# Patient Record
Sex: Female | Born: 2000 | Race: Black or African American | Hispanic: No | Marital: Single | Smoking: Never smoker
Health system: Southern US, Community
[De-identification: ages and names within clinical notes are randomized; demographics above are authoritative.]

## PROBLEM LIST (undated history)

## (undated) DIAGNOSIS — D573 Sickle-cell trait: Secondary | ICD-10-CM

## (undated) DIAGNOSIS — G43909 Migraine, unspecified, not intractable, without status migrainosus: Secondary | ICD-10-CM

## (undated) DIAGNOSIS — D649 Anemia, unspecified: Secondary | ICD-10-CM

## (undated) HISTORY — PX: ADENOIDECTOMY: SUR15

## (undated) HISTORY — DX: Sickle-cell trait: D57.3

## (undated) HISTORY — DX: Migraine, unspecified, not intractable, without status migrainosus: G43.909

## (undated) HISTORY — PX: STENT PLACEMENT VASCULAR (ARMC HX): HXRAD1737

---
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
## 2021-01-06 ENCOUNTER — Other Ambulatory Visit: Payer: Medicaid Other

## 2021-01-15 ENCOUNTER — Encounter (HOSPITAL_COMMUNITY): Payer: Self-pay

## 2021-01-15 ENCOUNTER — Ambulatory Visit (HOSPITAL_COMMUNITY)
Admission: EM | Admit: 2021-01-15 | Discharge: 2021-01-15 | Disposition: A | Discharge status: Home / Self Care | Payer: Medicaid Other | Attending: Student | Admitting: Student

## 2021-01-15 ENCOUNTER — Other Ambulatory Visit: Payer: Self-pay

## 2021-01-15 DIAGNOSIS — E119 Type 2 diabetes mellitus without complications: Secondary | ICD-10-CM

## 2021-01-15 DIAGNOSIS — Z7984 Long term (current) use of oral hypoglycemic drugs: Secondary | ICD-10-CM | POA: Diagnosis not present

## 2021-01-15 DIAGNOSIS — E118 Type 2 diabetes mellitus with unspecified complications: Secondary | ICD-10-CM | POA: Diagnosis not present

## 2021-01-15 DIAGNOSIS — S46911A Strain of unspecified muscle, fascia and tendon at shoulder and upper arm level, right arm, initial encounter: Secondary | ICD-10-CM

## 2021-01-15 MED ORDER — PREDNISONE 20 MG PO TABS: 20.0000 mg | ORAL_TABLET | Freq: Every day | ORAL | 0 refills | Status: AC

## 2021-01-15 MED ORDER — TIZANIDINE HCL 2 MG PO CAPS: 2.0000 mg | ORAL_CAPSULE | Freq: Three times a day (TID) | ORAL | 0 refills | Status: DC

## 2021-01-15 MED ORDER — NAPROXEN 500 MG PO TABS: 500.0000 mg | ORAL_TABLET | Freq: Two times a day (BID) | ORAL | 0 refills | Status: DC

## 2021-01-15 NOTE — ED Provider Notes (Signed)
[facility]    CSN: [id number] Arrival date & time: 01/15/21  1003      History   Chief Complaint Chief Complaint  Patient presents with  . Shoulder Pain    HPI [name] is a 20 y.o. female presenting with R shoulder pain. History migraines, goiter, constipation, iron deficiency, morbid obesity, menorrhagia.  Endorses 1 week of right shoulder pain.  States this started when she woke up from sleep 1 week ago.  States she does not sleep on her right side, sleeps in multiple positions throughout the night.  Right-sided neck pain as well.  Describes his pain as mild at rest, worse with abduction of her right arm.  She is right-handed.  Denies numbness/tingling, radiation of pain down arm, back pain.  Denies trauma.  Denies GI symptoms.  States she could not be pregnant.  HPI  Past Medical History:  Diagnosis Date  . Migraine   . Sickle cell trait [facility](HCC)     Patient Active Problem List   Diagnosis Date Noted  . Goiter 12/21/2020  . Slow transit constipation 12/21/2020  . Iron deficiency 12/21/2020  . Absence of bladder continence 12/21/2020  . Morbid childhood obesity with BMI greater than 99th percentile for age [facility](HCC) 08/07/2017  . Pre-diabetes 08/07/2017  . Anovulatory cycle 08/07/2017  . Menorrhagia with irregular cycle 08/07/2017    Past Surgical History:  Procedure Laterality Date  . ADENOIDECTOMY      OB History    Gravida  0   Para  0   Term  0   Preterm  0   AB  0   Living  0     SAB  0   IAB  0   Ectopic  0   Multiple  0   Live Births  0            Home Medications    Prior to Admission medications   Medication Sig Start Date End Date Taking? Authorizing Provider  naproxen (NAPROSYN) 500 MG tablet Take 1 tablet (500 mg total) by mouth 2 (two) times daily. 01/15/21  Yes Rhys MartiniGraham, Chaye Misch E, PA-C  predniSONE (DELTASONE) 20 MG tablet Take 1 tablet (20 mg total) by mouth daily for 5 days. 01/15/21 01/20/21 Yes Rhys MartiniGraham, Gillie Fleites E, PA-C   tizanidine (ZANAFLEX) 2 MG capsule Take 1 capsule (2 mg total) by mouth 3 (three) times daily. 01/15/21  Yes Rhys MartiniGraham, Aakash Hollomon E, PA-C  Ferrous Fumarate (HEMOCYTE - 106 MG FE) 324 (106 Fe) MG TABS tablet Take 1 tablet (106 mg of iron total) by mouth daily. 12/21/20   Verneda SkillHacker, Caroline T, FNP  metFORMIN (GLUCOPHAGE XR) 500 MG 24 hr tablet Take 1 tablet (500 mg total) by mouth daily with breakfast for 14 days, THEN 2 tablets (1,000 mg total) daily with breakfast for 14 days. 12/21/20 01/18/21  Verneda SkillHacker, Caroline T, FNP  norethindrone-ethinyl estradiol (JUNEL FE 1/20) 1-20 MG-MCG tablet Take 1 tablet by mouth daily. Patient not taking: Reported on 12/29/2019 12/09/19 01/09/20  Dahlia ByesBast, Traci A, NP  norgestrel-ethinyl estradiol (LO/OVRAL) 0.3-30 MG-MCG tablet Take 2 tablets by mouth daily. Until bleeding stops. Then take 1 tablet daily to finish pack. 12/29/19 01/09/20  Dessa PhiBadik, Jennifer, MD    Family History Family History  Problem Relation Age of Onset  . Sickle cell trait Mother   . Anxiety disorder Mother   . Migraines Brother   . Allergic rhinitis Brother   . Migraines Maternal Grandmother   . Hypertension Maternal Grandmother   .  Heart disease Maternal Grandmother   . Asthma Paternal Grandmother   . Menstrual problems Sister   . Allergic rhinitis Sister   . Hypertension Maternal Grandfather   . Hyperlipidemia Maternal Grandfather   . Sickle cell trait Maternal Grandfather   . Menstrual problems Paternal Aunt   . Fibroids Paternal Aunt     Social History Social History   Tobacco Use  . Smoking status: Passive Smoke Exposure - Never Smoker  . Smokeless tobacco: Never Used  . Tobacco comment: smoking outside  Vaping Use  . Vaping Use: Never used  Substance Use Topics  . Alcohol use: No  . Drug use: No     Allergies   Patient has no known allergies.   Review of Systems Review of Systems  Musculoskeletal:       R shoulder pain  All other systems reviewed and are negative.    Physical  Exam Triage Vital Signs ED Triage Vitals  Enc Vitals Group     BP      Pulse      Resp      Temp      Temp src      SpO2      Weight      Height      Head Circumference      Peak Flow      Pain Score      Pain Loc      Pain Edu?      Excl. in GC?    No data found.  Updated Vital Signs BP (!) 147/82 (BP Location: Right Arm)   Pulse 88   Temp 98.9 F (37.2 C)   Resp 18   SpO2 100%   Visual Acuity Right Eye Distance:   Left Eye Distance:   Bilateral Distance:    Right Eye Near:   Left Eye Near:    Bilateral Near:     Physical Exam Vitals reviewed.  Constitutional:      General: She is not in acute distress.    Appearance: Normal appearance. She is not ill-appearing.  HENT:     Head: Normocephalic and atraumatic.  Eyes:     Extraocular Movements: Extraocular movements intact.     Pupils: Pupils are equal, round, and reactive to light.  Cardiovascular:     Rate and Rhythm: Normal rate and regular rhythm.     Heart sounds: Normal heart sounds.  Pulmonary:     Effort: Pulmonary effort is normal.     Breath sounds: Normal breath sounds and air entry.  Abdominal:     Palpations: Abdomen is soft.     Tenderness: There is no abdominal tenderness. There is no right CVA tenderness, left CVA tenderness, guarding or rebound.     Comments: No bowel or bladder incontinence.  Musculoskeletal:     Cervical back: Normal range of motion. No swelling, deformity, signs of trauma, rigidity, spasms, tenderness, bony tenderness or crepitus. No pain with movement.     Thoracic back: No swelling, deformity, signs of trauma, spasms, tenderness or bony tenderness. Normal range of motion. No scoliosis.     Lumbar back: No swelling, deformity, signs of trauma, spasms, tenderness or bony tenderness. Normal range of motion. Negative right straight leg raise test and negative left straight leg raise test. No scoliosis.     Comments: Strength 5/5 in UEs and LEs, sensation intact. R  shoulder pain elicited with abduction R arm. Proximal R trapexius pain to deep palpation. R-sided cervical paraspinous  tenderness to dep palpation; pain elicited with flexion cervical spine. No cervical spinous tenderness, deformity, stepoff. Negative: crossbody abduction, Apley scratch test, empty beer can, Neer test. Grip strength 5/5.   absolutely no other pain, tenderness, deformity, ecchymosis.  Skin:    Capillary Refill: Capillary refill takes less than 2 seconds.  Neurological:     General: No focal deficit present.     Mental Status: She is alert.     Cranial Nerves: No cranial nerve deficit.     Comments: Strength 5/5 in UEs and LEs. Gait normal. Sensation intact in UEs and LEs.   Psychiatric:        Mood and Affect: Mood normal.        Behavior: Behavior normal.        Thought Content: Thought content normal.        Judgment: Judgment normal.      UC Treatments / Results  Labs (all labs ordered are listed, but only abnormal results are displayed) Labs Reviewed - No data to display  EKG   Radiology No results found.  Procedures Procedures (including critical care time)  Medications Ordered in UC Medications - No data to display  Initial Impression / Assessment and Plan / UC Course  I have reviewed the triage vital signs and the nursing notes.  Pertinent labs & imaging results that were available during my care of the patient were reviewed by me and considered in my medical decision making (see chart for details).     This patient is a 20 year old female presenting with right shoulder impingement. Today this pt is afebrile nontachycardic nontachypneic, oxygenating well on room air, no wheezes rhonchi or rales.   Zanaflex and naproxen as below.  Last A1c was 6.3 1 month ago. Plan to treat with low-dose of prednisone- 20mg  x5 days. Continue metformin.  States she could not be pregnant.  Return precautions discussed.  This chart was dictated using voice  recognition software, Dragon. Despite the best efforts of this provider to proofread and correct errors, errors may still occur which can change documentation meaning.   Final Clinical Impressions(s) / UC Diagnoses   Final diagnoses:  Strain of right shoulder, initial encounter  Type 2 diabetes mellitus without complication, without long-term current use of insulin (HCC)  Diabetes mellitus treated with oral medication (HCC)     Discharge Instructions     -Use the muscle relaxer-Zanaflex (tizanidine)-up to 3 times daily for muscular spasms and pain.  This can make you drowsy, so take before bed or when you do not have to drive or operate machinery. -Also start the prednisone, 1 pill daily for 5 days.  Try to take this earlier in the day as it can give you energy. -Use the pain medication as needed-naproxen, 1 pill up to twice daily with food.  Avoid other NSAIDs while on this medication, like ibuprofen or Aleve.  You can still take Tylenol 1000 mg 3 times daily. -Seek additional medical attention if you experience worsening of symptoms despite treatment.    ED Prescriptions    Medication Sig Dispense Auth. Provider   tizanidine (ZANAFLEX) 2 MG capsule Take 1 capsule (2 mg total) by mouth 3 (three) times daily. 21 capsule , PA-C   predniSONE (DELTASONE) 20 MG tablet Take 1 tablet (20 mg total) by mouth daily for 5 days. 5 tablet Rhys Martini, PA-C   naproxen (NAPROSYN) 500 MG tablet Take 1 tablet (500 mg total) by mouth 2 (two) times daily.  30 tablet Rhys Martini, PA-C     PDMP not reviewed this encounter.   Rhys Martini, PA-C 01/15/21 1113

## 2021-01-15 NOTE — Discharge Instructions (Addendum)
-  Use the muscle relaxer-Zanaflex (tizanidine)-up to 3 times daily for muscular spasms and pain.  This can make you drowsy, so take before bed or when you do not have to drive or operate machinery. -Also start the prednisone, 1 pill daily for 5 days.  Try to take this earlier in the day as it can give you energy. -Use the pain medication as needed-naproxen, 1 pill up to twice daily with food.  Avoid other NSAIDs while on this medication, like ibuprofen or Aleve.  You can still take Tylenol 1000 mg 3 times daily. -Seek additional medical attention if you experience worsening of symptoms despite treatment.

## 2021-01-15 NOTE — ED Triage Notes (Signed)
Pt in with c/o right shoulder pain that has been going on for 1 week. States she was just sleeping and woke up with pain  Pt has taken tylenol, ibuprofen and warm compress with no relief

## 2021-01-18 ENCOUNTER — Ambulatory Visit: Payer: Medicaid Other | Admitting: Pediatrics

## 2021-02-23 ENCOUNTER — Other Ambulatory Visit (INDEPENDENT_AMBULATORY_CARE_PROVIDER_SITE_OTHER): Payer: Self-pay | Admitting: Pediatric Endocrinology

## 2021-03-26 ENCOUNTER — Encounter (HOSPITAL_COMMUNITY): Payer: Self-pay | Admitting: Emergency Medicine

## 2021-03-26 ENCOUNTER — Ambulatory Visit (HOSPITAL_COMMUNITY)
Admission: EM | Admit: 2021-03-26 | Discharge: 2021-03-26 | Disposition: A | Discharge status: Home / Self Care | Payer: Medicaid Other | Attending: Internal Medicine | Admitting: Internal Medicine

## 2021-03-26 DIAGNOSIS — Z3202 Encounter for pregnancy test, result negative: Secondary | ICD-10-CM | POA: Diagnosis not present

## 2021-03-26 DIAGNOSIS — F410 Panic disorder [episodic paroxysmal anxiety] without agoraphobia: Secondary | ICD-10-CM | POA: Diagnosis not present

## 2021-03-26 LAB — POCT URINALYSIS DIPSTICK, ED / UC
Glucose, UA: NEGATIVE mg/dL
Hgb urine dipstick: NEGATIVE
Leukocytes,Ua: NEGATIVE
Nitrite: NEGATIVE
Protein, ur: NEGATIVE mg/dL
Specific Gravity, Urine: 1.025 (ref 1.005–1.030)
Urobilinogen, UA: 0.2 mg/dL (ref 0.0–1.0)
pH: 6 (ref 5.0–8.0)

## 2021-03-26 LAB — POC URINE PREG, ED: Preg Test, Ur: NEGATIVE

## 2021-03-26 MED ORDER — HYDROXYZINE HCL 25 MG PO TABS: 25.0000 mg | ORAL_TABLET | Freq: Three times a day (TID) | ORAL | 0 refills | Status: DC | PRN

## 2021-03-26 NOTE — Discharge Instructions (Addendum)
-  I sent a medication that you can take as needed for anxiety and panic attacks.  This is called hydroxyzine (Atarax), 1 pill up to every 8 hours as needed.  This medication can make you drowsy, so be careful to avoid driving and operating machinery. -Follow-up with your primary care provider to discuss other medication options for anxiety like an SSRI. -If you ever experience suicidal ideation, thoughts of hurting yourself, or homicidal ideation-head straight to the emergency department or call 911.

## 2021-03-26 NOTE — ED Provider Notes (Signed)
[facility]    CSN: [id number] Arrival date & time: 03/26/21  1233      History   Chief Complaint Chief Complaint  Patient presents with  . Headache  . Nausea    HPI Heather Marsh is a 20 y.o. female presenting with residual headaches and nausea following panic attack that occurred 1 day ago.  Medical history anxiety and panic attacks, though she does not take any medications for this.  States she has a history of panic attacks related to social anxiety, but has not had one in months.  States she did have 1 yesterday that lasted about 30 minutes.  Endorses sensation of dizziness and shortness of breath during the episode.  She was at home on the couch watching TV when it happened.  Denied chest pain during or after the event.  Currently with some nausea and throbbing headache behind forehead, but no vomiting, diarrhea, back pain.  Denies family history of early cardiac disease or death.  HPI  Past Medical History:  Diagnosis Date  . Migraine   . Sickle cell trait [facility])     Patient Active Problem List   Diagnosis Date Noted  . Goiter 12/21/2020  . Slow transit constipation 12/21/2020  . Iron deficiency 12/21/2020  . Absence of bladder continence 12/21/2020  . Morbid childhood obesity with BMI greater than 99th percentile for age [facility]) 08/07/2017  . Pre-diabetes 08/07/2017  . Anovulatory cycle 08/07/2017  . Menorrhagia with irregular cycle 08/07/2017    Past Surgical History:  Procedure Laterality Date  . ADENOIDECTOMY      OB History    Gravida  0   Para  0   Term  0   Preterm  0   AB  0   Living  0     SAB  0   IAB  0   Ectopic  0   Multiple  0   Live Births  0            Home Medications    Prior to Admission medications   Medication Sig Start Date End Date Taking? Authorizing Provider  hydrOXYzine (ATARAX/VISTARIL) 25 MG tablet Take 1 tablet (25 mg total) by mouth every 8 (eight) hours as needed. 03/26/21  Yes Rhys Martini,  PA-C  metFORMIN (GLUCOPHAGE XR) 500 MG 24 hr tablet Take 1 tablet (500 mg total) by mouth daily with breakfast for 14 days, THEN 2 tablets (1,000 mg total) daily with breakfast for 14 days. 12/21/20 01/18/21 Yes Verneda Skill, FNP  Ferrous Fumarate (HEMOCYTE - 106 MG FE) 324 (106 Fe) MG TABS tablet Take 1 tablet (106 mg of iron total) by mouth daily. 12/21/20 03/26/21  Verneda Skill, FNP  norethindrone-ethinyl estradiol (JUNEL FE 1/20) 1-20 MG-MCG tablet Take 1 tablet by mouth daily. Patient not taking: Reported on 12/29/2019 12/09/19 01/09/20  Dahlia Byes A, NP  norgestrel-ethinyl estradiol (LO/OVRAL) 0.3-30 MG-MCG tablet Take 2 tablets by mouth daily. Until bleeding stops. Then take 1 tablet daily to finish pack. 12/29/19 01/09/20  Dessa Phi, MD    Family History Family History  Problem Relation Age of Onset  . Sickle cell trait Mother   . Anxiety disorder Mother   . Migraines Brother   . Allergic rhinitis Brother   . Migraines Maternal Grandmother   . Hypertension Maternal Grandmother   . Heart disease Maternal Grandmother   . Asthma Paternal Grandmother   . Menstrual problems Sister   . Allergic rhinitis Sister   .  Hypertension Maternal Grandfather   . Hyperlipidemia Maternal Grandfather   . Sickle cell trait Maternal Grandfather   . Menstrual problems Paternal Aunt   . Fibroids Paternal Aunt     Social History Social History   Tobacco Use  . Smoking status: Passive Smoke Exposure - Never Smoker  . Smokeless tobacco: Never Used  . Tobacco comment: smoking outside  Vaping Use  . Vaping Use: Never used  Substance Use Topics  . Alcohol use: No  . Drug use: No     Allergies   Patient has no known allergies.   Review of Systems Review of Systems  Gastrointestinal: Positive for nausea.  Neurological: Positive for headaches.  Psychiatric/Behavioral: The patient is nervous/anxious.   All other systems reviewed and are negative.    Physical Exam Triage Vital  Signs ED Triage Vitals  Enc Vitals Group     BP 03/26/21 1419 129/88     Pulse Rate 03/26/21 1419 100     Resp 03/26/21 1419 18     Temp 03/26/21 1419 97.9 F (36.6 C)     Temp Source 03/26/21 1419 Oral     SpO2 03/26/21 1419 99 %     Weight --      Height --      Head Circumference --      Peak Flow --      Pain Score 03/26/21 1414 4     Pain Loc --      Pain Edu? --      Excl. in GC? --    No data found.  Updated Vital Signs BP 129/88 (BP Location: Left Arm)   Pulse 100   Temp 97.9 F (36.6 C) (Oral)   Resp 18   LMP 01/21/2021 (Approximate)   SpO2 99%   Visual Acuity Right Eye Distance:   Left Eye Distance:   Bilateral Distance:    Right Eye Near:   Left Eye Near:    Bilateral Near:     Physical Exam Vitals reviewed.  Constitutional:      General: She is not in acute distress.    Appearance: Normal appearance. She is not ill-appearing.  HENT:     Head: Normocephalic and atraumatic.     Mouth/Throat:     Mouth: Mucous membranes are moist.     Comments: Moist mucous membranes Eyes:     Extraocular Movements: Extraocular movements intact.     Pupils: Pupils are equal, round, and reactive to light.  Cardiovascular:     Rate and Rhythm: Normal rate and regular rhythm.     Heart sounds: Normal heart sounds.  Pulmonary:     Effort: Pulmonary effort is normal.     Breath sounds: Normal breath sounds. No wheezing, rhonchi or rales.  Abdominal:     General: Bowel sounds are normal. There is no distension.     Palpations: Abdomen is soft. There is no mass.     Tenderness: There is no abdominal tenderness. There is no right CVA tenderness, left CVA tenderness, guarding or rebound.  Skin:    General: Skin is warm.     Capillary Refill: Capillary refill takes less than 2 seconds.     Comments: Good skin turgor  Neurological:     General: No focal deficit present.     Mental Status: She is alert and oriented to person, place, and time.     Comments: PERRLA,  EOMI CN 2-12 grossly intact  Psychiatric:  Mood and Affect: Mood normal.        Behavior: Behavior normal.      UC Treatments / Results  Labs (all labs ordered are listed, but only abnormal results are displayed) Labs Reviewed  POCT URINALYSIS DIPSTICK, ED / UC - Abnormal; Notable for the following components:      Result Value   Bilirubin Urine SMALL (*)    Ketones, ur TRACE (*)    All other components within normal limits  POC URINE PREG, ED    EKG   Radiology No results found.  Procedures Procedures (including critical care time)  Medications Ordered in UC Medications - No data to display  Initial Impression / Assessment and Plan / UC Course  I have reviewed the triage vital signs and the nursing notes.  Pertinent labs & imaging results that were available during my care of the patient were reviewed by me and considered in my medical decision making (see chart for details).     This patient is an 20 year old female presenting following panic attack that occurred 1 day ago.  Benign exam today.  Irregular periods. Negative pregnancy test today.  Hydroxyzine sent as below.  Follow-up with PCP. Denies SI, HI.  ED return precautions discussed.  Final Clinical Impressions(s) / UC Diagnoses   Final diagnoses:  Negative pregnancy test  Panic attack     Discharge Instructions     -I sent a medication that you can take as needed for anxiety and panic attacks.  This is called hydroxyzine (Atarax), 1 pill up to every 8 hours as needed.  This medication can make you drowsy, so be careful to avoid driving and operating machinery. -Follow-up with your primary care provider to discuss other medication options for anxiety like an SSRI. -If you ever experience suicidal ideation, thoughts of hurting yourself, or homicidal ideation-head straight to the emergency department or call 911.    ED Prescriptions    Medication Sig Dispense Auth. Provider   hydrOXYzine  (ATARAX/VISTARIL) 25 MG tablet Take 1 tablet (25 mg total) by mouth every 8 (eight) hours as needed. 21 tablet Rhys Martini, PA-C     PDMP not reviewed this encounter.   Rhys Martini, PA-C 03/26/21 1529

## 2021-03-26 NOTE — ED Triage Notes (Signed)
Pt reports having a panic attack yesterday. Today she has a headache and stomach ache. LMP 01/21/2021 (irregular menses) Denies being sexually active.

## 2021-09-20 ENCOUNTER — Other Ambulatory Visit: Payer: Self-pay | Admitting: Pediatrics

## 2021-09-20 DIAGNOSIS — R7303 Prediabetes: Secondary | ICD-10-CM

## 2021-10-16 IMAGING — US US PELVIS COMPLETE WITH TRANSVAGINAL
1 series · 15 of 25 positions shown · non-contrast
Comparison: None

CLINICAL DATA: Abnormal uterine bleeding

EXAM:
TRANSABDOMINAL AND TRANSVAGINAL ULTRASOUND OF PELVIS
TECHNIQUE: Both transabdominal and transvaginal ultrasound examinations of the
pelvis were performed. Transabdominal technique was performed for
global imaging of the pelvis including uterus, ovaries, adnexal
regions, and pelvic cul-de-sac. It was necessary to proceed with
endovaginal exam following the transabdominal exam to visualize the
endometrium and ovaries.

[Series 1: us pelvis complete with transvaginal · 15 of 61 slices shown]
[im 1/61]
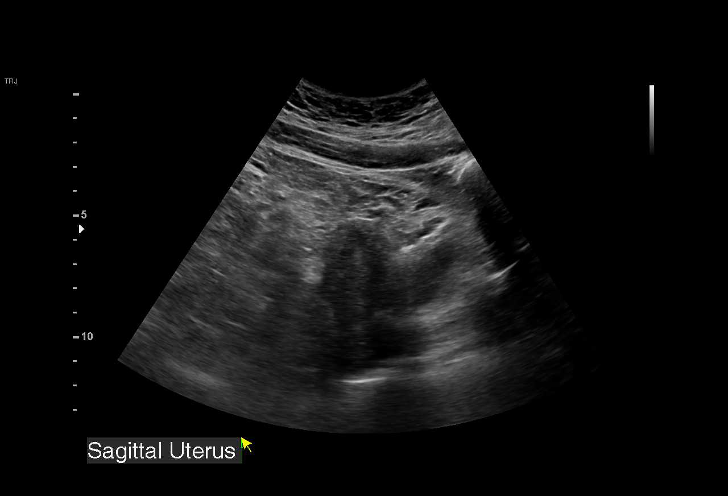
[im 6/61]
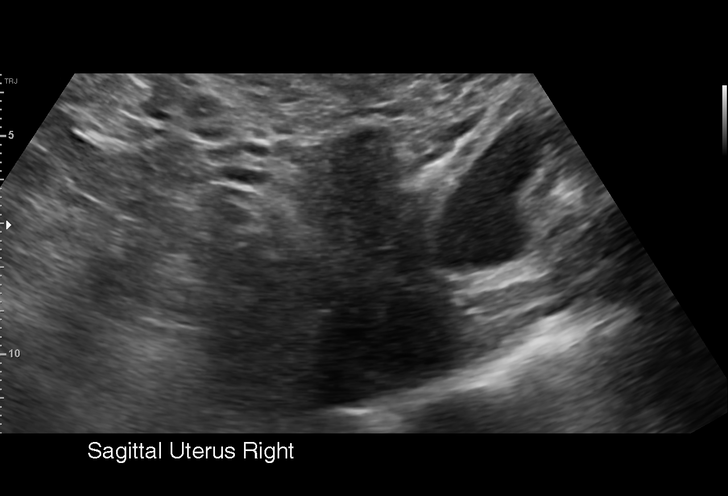
[im 11/61]
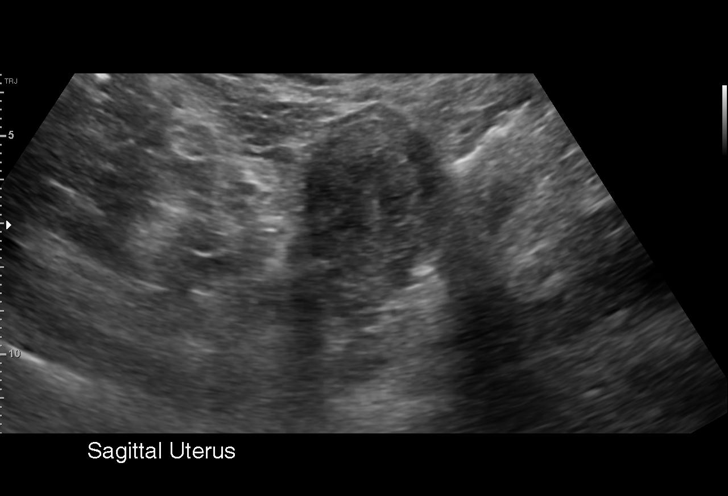
[im 13/61]
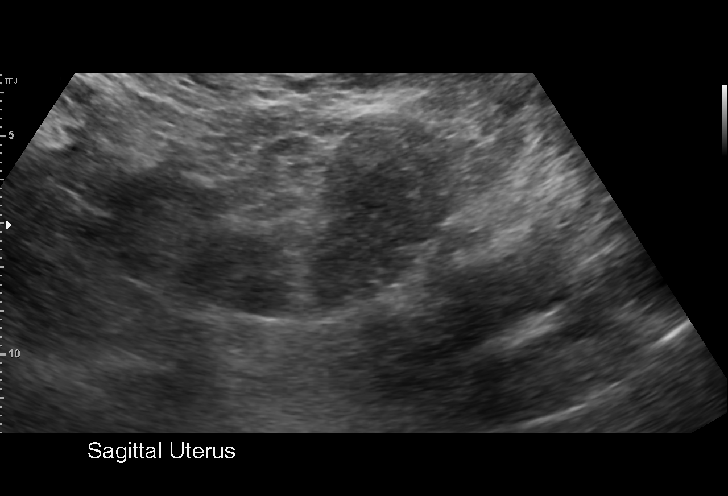
[im 18/61]
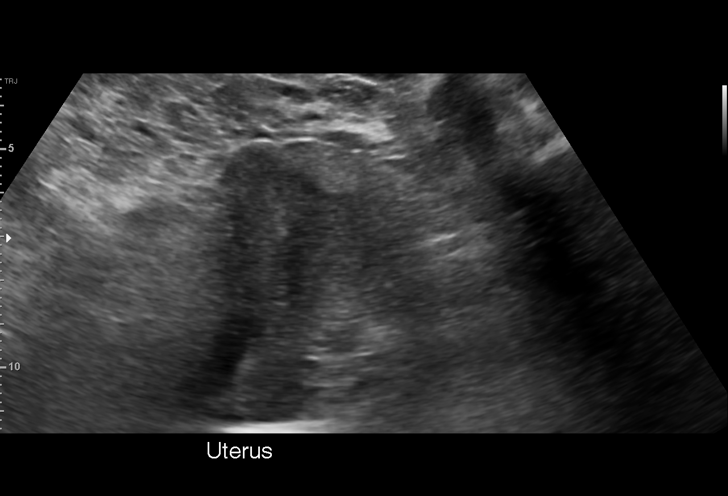
[im 23/61]
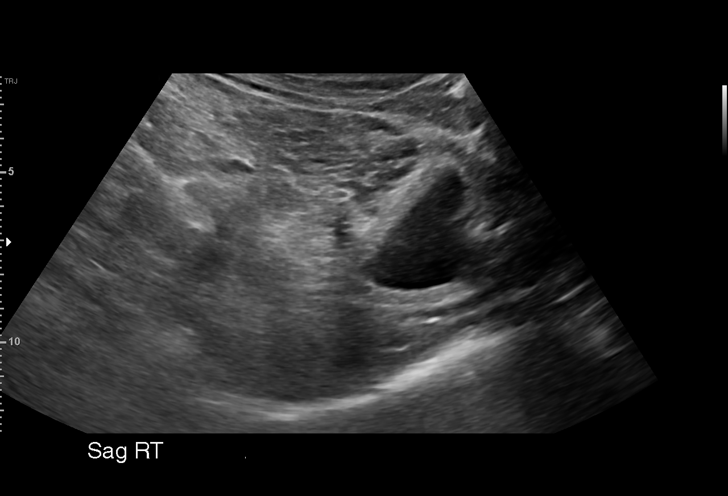
[im 26/61]
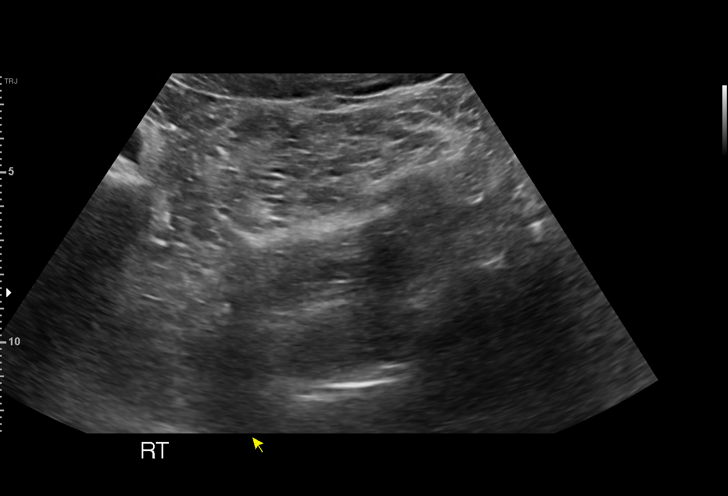
[im 31/61]
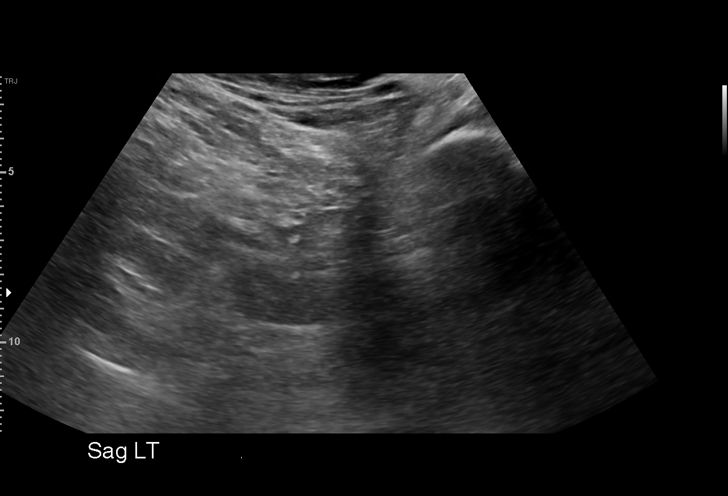
[im 36/61]
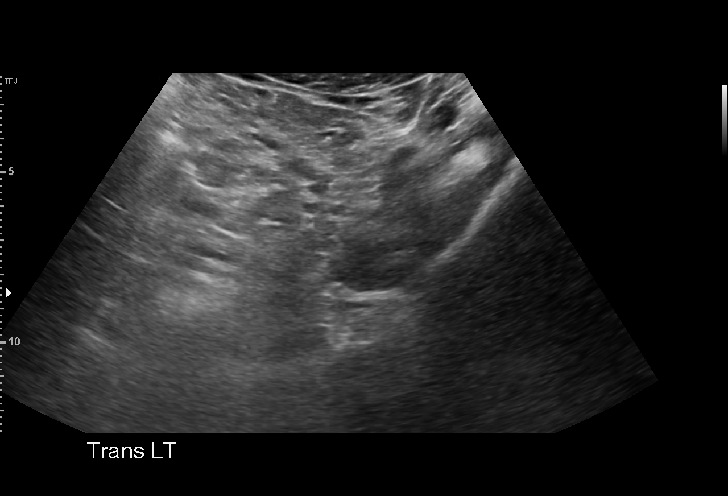
[im 38/61]
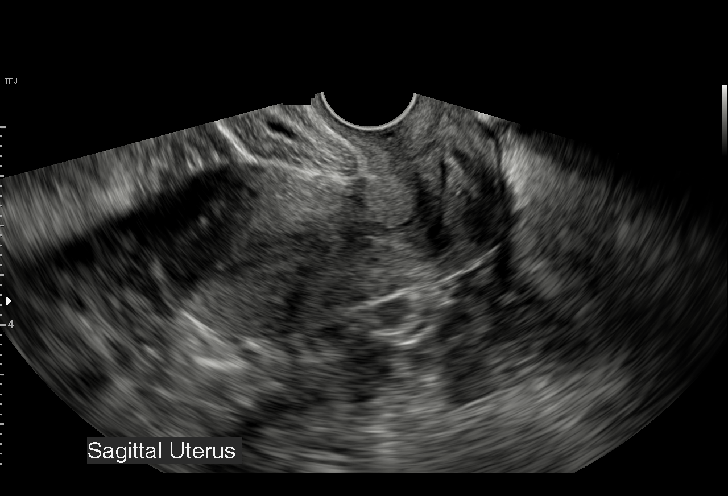
[im 43/61]
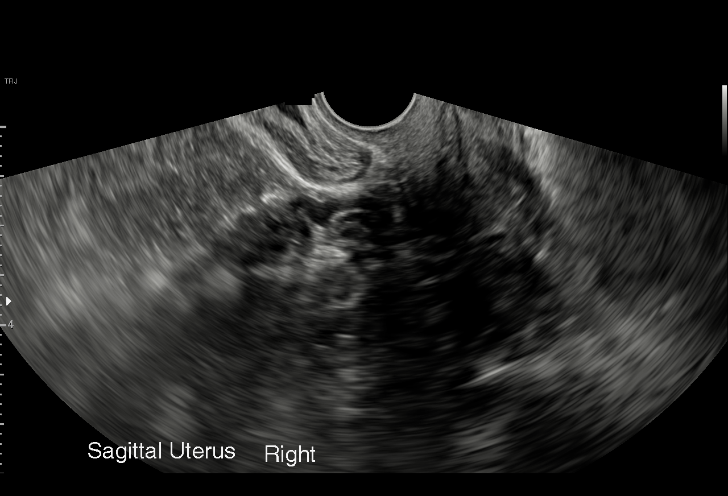
[im 48/61]
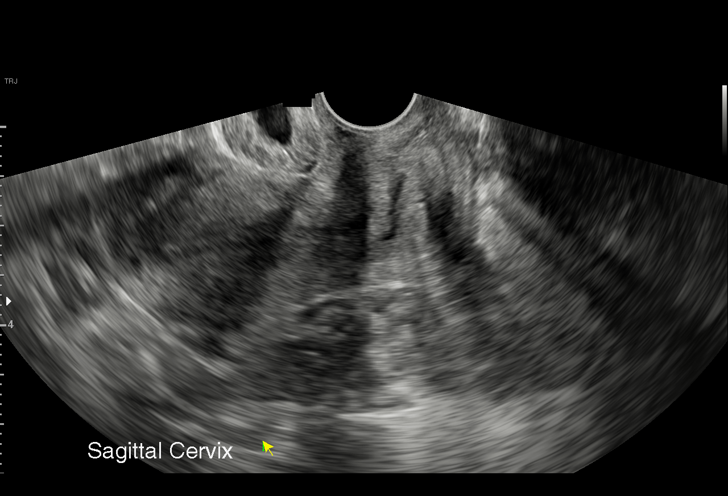
[im 51/61]
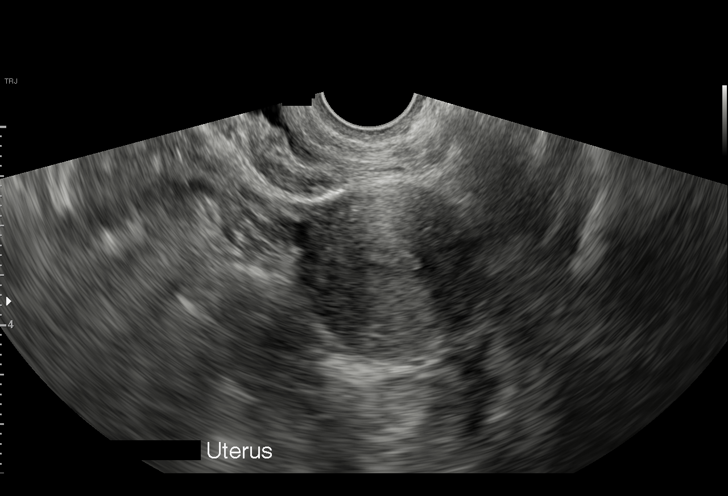
[im 56/61]
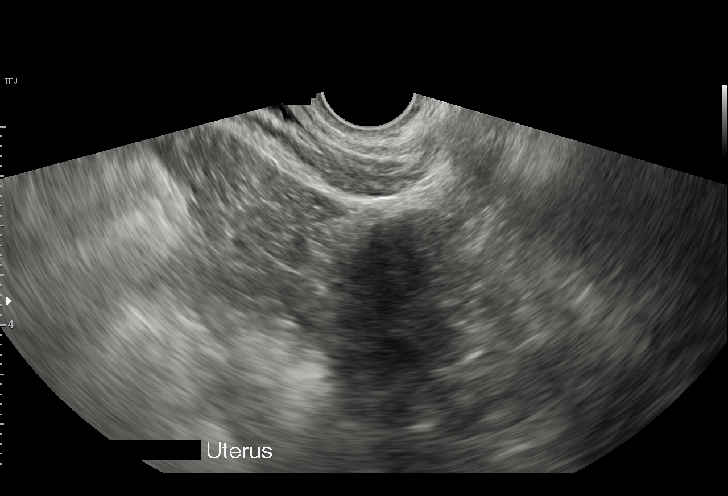
[im 61/61]
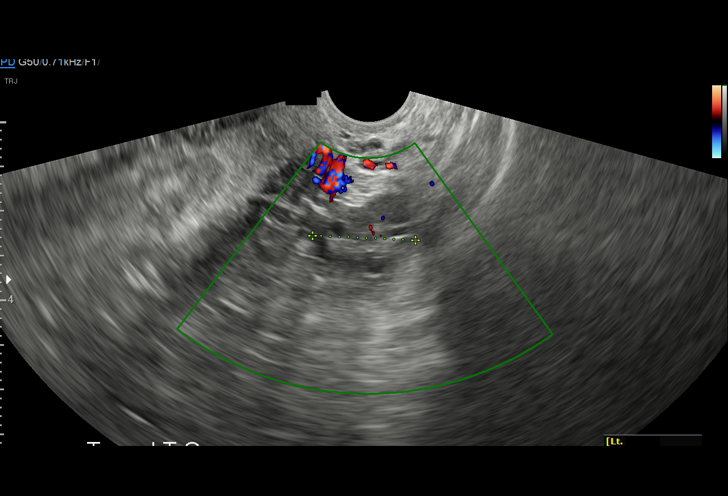

[15 of 25 positions shown; findings below may reference images not displayed]

FINDINGS: Uterus

Measurements: 6.8 x 3.6 x 3.6 cm = volume: 46 mL. Anteverted.
Heterogeneous myometrium. No focal mass.

Endometrium

Thickness: 3 mm.  No endometrial fluid or focal abnormality

Right ovary

Measurements: 3.4 x 2.0 x 2.3 cm = volume: 7.9 mL. Partially
obscured by bowel. No gross mass.

Left ovary

Measurements: 2.9 x 2.1 x 2.3 cm = volume: 7.3 mL. Normal morphology
without mass

Other findings

No free pelvic fluid or adnexal masses
IMPRESSION: Normal exam.

## 2022-06-02 DIAGNOSIS — K219 Gastro-esophageal reflux disease without esophagitis: Secondary | ICD-10-CM | POA: Insufficient documentation

## 2022-09-16 ENCOUNTER — Ambulatory Visit (HOSPITAL_COMMUNITY)
Admission: EM | Admit: 2022-09-16 | Discharge: 2022-09-16 | Disposition: A | Discharge status: Home / Self Care | Payer: Medicaid Other

## 2022-09-16 ENCOUNTER — Ambulatory Visit: Admit: 2022-09-16 | Discharge status: Against Medical Advice | Payer: Medicaid Other

## 2022-09-16 ENCOUNTER — Encounter (HOSPITAL_COMMUNITY): Payer: Self-pay

## 2022-09-16 DIAGNOSIS — B309 Viral conjunctivitis, unspecified: Secondary | ICD-10-CM

## 2022-09-16 NOTE — Discharge Instructions (Signed)
Can continue with allergy eye drops Could begin an allergy medication like Claritin or Zyrtec Avoid eye makeup until symptoms resolve Return if you develop new or worsening symptoms

## 2022-09-16 NOTE — ED Provider Notes (Signed)
[facility]    CSN: [id number] Arrival date & time: 09/16/22  1328      History   Chief Complaint Chief Complaint  Patient presents with   Conjunctivitis    HPI Heather Marsh is a 21 y.o. female.   Pt reports Thursday morning she woke up with eyes crusted shut due to green discharge.  She reports since then sx have improved.  She reports mild crusting upon waking today.  She denies congestion, rhinorrhea, sore throat.  She denies wearing glasses or contacts.  She has been using allergy eye drops with improvement.     Past Medical History:  Diagnosis Date   Migraine    Sickle cell trait [facility])     Patient Active Problem List   Diagnosis Date Noted   Goiter 12/21/2020   Slow transit constipation 12/21/2020   Iron deficiency 12/21/2020   Absence of bladder continence 12/21/2020   Morbid childhood obesity with BMI greater than 99th percentile for age [facility]) 08/07/2017   Pre-diabetes 08/07/2017   Anovulatory cycle 08/07/2017   Menorrhagia with irregular cycle 08/07/2017    Past Surgical History:  Procedure Laterality Date   ADENOIDECTOMY      OB History     Gravida  0   Para  0   Term  0   Preterm  0   AB  0   Living  0      SAB  0   IAB  0   Ectopic  0   Multiple  0   Live Births  0            Home Medications    Prior to Admission medications   Medication Sig Start Date End Date Taking? Authorizing Provider  hydrOXYzine (ATARAX/VISTARIL) 25 MG tablet Take 1 tablet (25 mg total) by mouth every 8 (eight) hours as needed. 03/26/21   Rhys Martini, PA-C  metFORMIN (GLUCOPHAGE XR) 500 MG 24 hr tablet Take 1 tablet (500 mg total) by mouth daily with breakfast for 14 days, THEN 2 tablets (1,000 mg total) daily with breakfast for 14 days. 12/21/20 01/18/21  Verneda Skill, FNP  Ferrous Fumarate (HEMOCYTE - 106 MG FE) 324 (106 Fe) MG TABS tablet Take 1 tablet (106 mg of iron total) by mouth daily. 12/21/20 03/26/21  Verneda Skill, FNP   norethindrone-ethinyl estradiol (JUNEL FE 1/20) 1-20 MG-MCG tablet Take 1 tablet by mouth daily. Patient not taking: Reported on 12/29/2019 12/09/19 01/09/20  Dahlia Byes A, NP  norgestrel-ethinyl estradiol (LO/OVRAL) 0.3-30 MG-MCG tablet Take 2 tablets by mouth daily. Until bleeding stops. Then take 1 tablet daily to finish pack. 12/29/19 01/09/20  Dessa Phi, MD    Family History Family History  Problem Relation Age of Onset   Sickle cell trait Mother    Anxiety disorder Mother    Migraines Brother    Allergic rhinitis Brother    Migraines Maternal Grandmother    Hypertension Maternal Grandmother    Heart disease Maternal Grandmother    Asthma Paternal Grandmother    Menstrual problems Sister    Allergic rhinitis Sister    Hypertension Maternal Grandfather    Hyperlipidemia Maternal Grandfather    Sickle cell trait Maternal Grandfather    Menstrual problems Paternal Aunt    Fibroids Paternal Aunt     Social History Social History   Tobacco Use   Smoking status: Passive Smoke Exposure - Never Smoker   Smokeless tobacco: Never   Tobacco comments:  smoking outside  Vaping Use   Vaping Use: Never used  Substance Use Topics   Alcohol use: No   Drug use: No     Allergies   Patient has no known allergies.   Review of Systems Review of Systems  Constitutional:  Negative for chills and fever.  HENT:  Negative for ear pain and sore throat.   Eyes:  Positive for discharge. Negative for pain and visual disturbance.  Respiratory:  Negative for cough and shortness of breath.   Cardiovascular:  Negative for chest pain and palpitations.  Gastrointestinal:  Negative for abdominal pain and vomiting.  Genitourinary:  Negative for dysuria and hematuria.  Musculoskeletal:  Negative for arthralgias and back pain.  Skin:  Negative for color change and rash.  Neurological:  Negative for seizures and syncope.  All other systems reviewed and are negative.    Physical  Exam Triage Vital Signs ED Triage Vitals  Enc Vitals Group     BP 09/16/22 1446 (!) 136/93     Pulse Rate 09/16/22 1446 95     Resp 09/16/22 1446 16     Temp 09/16/22 1446 (!) 97.5 F (36.4 C)     Temp Source 09/16/22 1446 Oral     SpO2 09/16/22 1446 92 %     Weight --      Height --      Head Circumference --      Peak Flow --      Pain Score 09/16/22 1445 0     Pain Loc --      Pain Edu? --      Excl. in GC? --    No data found.  Updated Vital Signs BP (!) 136/93 (BP Location: Left Wrist)   Pulse 95   Temp (!) 97.5 F (36.4 C) (Oral)   Resp 16   LMP 06/27/2022 (Approximate)   SpO2 92%   Visual Acuity Right Eye Distance: 20/25 Left Eye Distance: 20/25 Bilateral Distance: 20/15  Right Eye Near:   Left Eye Near:    Bilateral Near:     Physical Exam Vitals and nursing note reviewed.  Constitutional:      General: She is not in acute distress.    Appearance: She is well-developed.  HENT:     Head: Normocephalic and atraumatic.  Eyes:     Conjunctiva/sclera: Conjunctivae normal.  Cardiovascular:     Rate and Rhythm: Normal rate and regular rhythm.     Heart sounds: No murmur heard. Pulmonary:     Effort: Pulmonary effort is normal. No respiratory distress.     Breath sounds: Normal breath sounds.  Abdominal:     Palpations: Abdomen is soft.     Tenderness: There is no abdominal tenderness.  Musculoskeletal:        General: No swelling.     Cervical back: Neck supple.  Skin:    General: Skin is warm and dry.     Capillary Refill: Capillary refill takes less than 2 seconds.  Neurological:     Mental Status: She is alert.  Psychiatric:        Mood and Affect: Mood normal.      UC Treatments / Results  Labs (all labs ordered are listed, but only abnormal results are displayed) Labs Reviewed - No data to display  EKG   Radiology No results found.  Procedures Procedures (including critical care time)  Medications Ordered in UC Medications -  No data to display  Initial Impression / Assessment  and Plan / UC Course  I have reviewed the triage vital signs and the nursing notes.  Pertinent labs & imaging results that were available during my care of the patient were reviewed by me and considered in my medical decision making (see chart for details).     On exam no redness, drainage, or crusting noticed.  Reports sx have improved since Thursday.  Advised continue use of allergy eye drops if those are providing relief.  Reports when eye drops wear off she has some eye itchiness.  Advised daily allergy pill.  Return precautions discussed.  Final Clinical Impressions(s) / UC Diagnoses   Final diagnoses:  Acute viral conjunctivitis of left eye     Discharge Instructions      Can continue with allergy eye drops Could begin an allergy medication like Claritin or Zyrtec Avoid eye makeup until symptoms resolve Return if you develop new or worsening symptoms     ED Prescriptions   None    PDMP not reviewed this encounter.   Ward, Tylene Fantasia, PA-C 09/16/22 [phone]

## 2022-09-16 NOTE — ED Triage Notes (Signed)
Chief Complaint: Patient woke up Thursday morning unable to open the eye, green discharge, pink in the eye, crusted shut.   Onset: Thursday   OTC medications tried: Yes- OTC eye drops    with mild relief  Sick exposure: No

## 2022-09-21 ENCOUNTER — Ambulatory Visit (INDEPENDENT_AMBULATORY_CARE_PROVIDER_SITE_OTHER): Payer: Medicaid Other

## 2022-09-21 ENCOUNTER — Ambulatory Visit
Admission: EM | Admit: 2022-09-21 | Discharge: 2022-09-21 | Disposition: A | Discharge status: Home / Self Care | Payer: Medicaid Other | Attending: Internal Medicine | Admitting: Internal Medicine

## 2022-09-21 DIAGNOSIS — M25572 Pain in left ankle and joints of left foot: Secondary | ICD-10-CM

## 2022-09-21 DIAGNOSIS — S99912A Unspecified injury of left ankle, initial encounter: Secondary | ICD-10-CM

## 2022-09-21 NOTE — ED Triage Notes (Signed)
Pt c/o fall when walking dog last night and falling on ankle. Causing pain.

## 2022-09-22 NOTE — ED Provider Notes (Signed)
[facility]    CSN: [id number] Arrival date & time: 09/21/22  1809      History   Chief Complaint Chief Complaint  Patient presents with   Ankle Injury    HPI Heather Marsh is a 21 y.o. female.   Patient here today for evaluation of ankle injury.  She reports that she accidentally missed stepped while walking her dog.  Since that time she has had pain to her left lateral ankle.  She has not had any numbness or tingling.  Injury occurred last night.  She notes that weightbearing worsens pain as well as movement of her foot.  The history is provided by the patient.  Ankle Injury    Past Medical History:  Diagnosis Date   Migraine    Sickle cell trait [facility])     Patient Active Problem List   Diagnosis Date Noted   Goiter 12/21/2020   Slow transit constipation 12/21/2020   Iron deficiency 12/21/2020   Absence of bladder continence 12/21/2020   Morbid childhood obesity with BMI greater than 99th percentile for age [facility]) 08/07/2017   Pre-diabetes 08/07/2017   Anovulatory cycle 08/07/2017   Menorrhagia with irregular cycle 08/07/2017    Past Surgical History:  Procedure Laterality Date   ADENOIDECTOMY      OB History     Gravida  0   Para  0   Term  0   Preterm  0   AB  0   Living  0      SAB  0   IAB  0   Ectopic  0   Multiple  0   Live Births  0            Home Medications    Prior to Admission medications   Medication Sig Start Date End Date Taking? Authorizing Provider  hydrOXYzine (ATARAX/VISTARIL) 25 MG tablet Take 1 tablet (25 mg total) by mouth every 8 (eight) hours as needed. 03/26/21   Rhys Martini, PA-C  metFORMIN (GLUCOPHAGE XR) 500 MG 24 hr tablet Take 1 tablet (500 mg total) by mouth daily with breakfast for 14 days, THEN 2 tablets (1,000 mg total) daily with breakfast for 14 days. 12/21/20 01/18/21  Verneda Skill, FNP  Ferrous Fumarate (HEMOCYTE - 106 MG FE) 324 (106 Fe) MG TABS tablet Take 1 tablet (106 mg of  iron total) by mouth daily. 12/21/20 03/26/21  Verneda Skill, FNP  norethindrone-ethinyl estradiol (JUNEL FE 1/20) 1-20 MG-MCG tablet Take 1 tablet by mouth daily. Patient not taking: Reported on 12/29/2019 12/09/19 01/09/20  Dahlia Byes A, NP  norgestrel-ethinyl estradiol (LO/OVRAL) 0.3-30 MG-MCG tablet Take 2 tablets by mouth daily. Until bleeding stops. Then take 1 tablet daily to finish pack. 12/29/19 01/09/20  Dessa Phi, MD    Family History Family History  Problem Relation Age of Onset   Sickle cell trait Mother    Anxiety disorder Mother    Migraines Brother    Allergic rhinitis Brother    Migraines Maternal Grandmother    Hypertension Maternal Grandmother    Heart disease Maternal Grandmother    Asthma Paternal Grandmother    Menstrual problems Sister    Allergic rhinitis Sister    Hypertension Maternal Grandfather    Hyperlipidemia Maternal Grandfather    Sickle cell trait Maternal Grandfather    Menstrual problems Paternal Aunt    Fibroids Paternal Aunt     Social History Social History   Tobacco Use   Smoking status: Passive  Smoke Exposure - Never Smoker   Smokeless tobacco: Never   Tobacco comments:    smoking outside  Vaping Use   Vaping Use: Never used  Substance Use Topics   Alcohol use: No   Drug use: No     Allergies   Patient has no known allergies.   Review of Systems Review of Systems  Constitutional:  Negative for chills and fever.  Eyes:  Negative for discharge and redness.  Gastrointestinal:  Negative for nausea and vomiting.  Musculoskeletal:  Positive for arthralgias and joint swelling.  Neurological:  Negative for numbness.     Physical Exam Triage Vital Signs ED Triage Vitals [09/21/22 1932]  Enc Vitals Group     BP 127/88     Pulse Rate 86     Resp 16     Temp 97.8 F (36.6 C)     Temp Source Oral     SpO2 97 %     Weight      Height      Head Circumference      Peak Flow      Pain Score 6     Pain Loc      Pain Edu?       Excl. in GC?    No data found.  Updated Vital Signs BP 127/88 (BP Location: Left Arm)   Pulse 86   Temp 97.8 F (36.6 C) (Oral)   Resp 16   LMP 06/27/2022 (Approximate)   SpO2 97%     Physical Exam Vitals and nursing note reviewed.  Constitutional:      General: She is not in acute distress.    Appearance: Normal appearance. She is not ill-appearing.  HENT:     Head: Normocephalic and atraumatic.  Eyes:     Conjunctiva/sclera: Conjunctivae normal.  Cardiovascular:     Rate and Rhythm: Normal rate.  Pulmonary:     Effort: Pulmonary effort is normal. No respiratory distress.  Musculoskeletal:     Comments: Mild swelling noted to left lateral malleolus with associated TTP, decreased ROM of left ankle due to pain.  Neurological:     Mental Status: She is alert.     Comments: Gross sensation intact to left toes  Psychiatric:        Mood and Affect: Mood normal.        Behavior: Behavior normal.        Thought Content: Thought content normal.      UC Treatments / Results  Labs (all labs ordered are listed, but only abnormal results are displayed) Labs Reviewed - No data to display  EKG   Radiology DG Ankle Complete Left  Result Date: 09/21/2022 CLINICAL DATA:  Ankle injury. EXAM: LEFT ANKLE COMPLETE - 3+ VIEW COMPARISON:  None Available. FINDINGS: There is no evidence of fracture, dislocation, or joint effusion. There is no evidence of arthropathy or other focal bone abnormality. Soft tissues are unremarkable. IMPRESSION: Negative. Electronically Signed   By: Obie Dredge M.D.   On: 09/21/2022 19:20    Procedures Procedures (including critical care time)  Medications Ordered in UC Medications - No data to display  Initial Impression / Assessment and Plan / UC Course  I have reviewed the triage vital signs and the nursing notes.  Pertinent labs & imaging results that were available during my care of the patient were reviewed by me and considered in my  medical decision making (see chart for details).    Xray without fracture. Discussed most  likely sprain and encouraged ibuprofen if needed for pain. Recommended RICE therapy and follow up with ortho if symptoms persist or worsen. Patient expresses understanding.   Final Clinical Impressions(s) / UC Diagnoses   Final diagnoses:  Left ankle injury, initial encounter   Discharge Instructions   None    ED Prescriptions   None    PDMP not reviewed this encounter.   Tomi Bamberger, PA-C 09/22/22 1609

## 2022-12-02 ENCOUNTER — Ambulatory Visit (HOSPITAL_COMMUNITY)
Admission: EM | Admit: 2022-12-02 | Discharge: 2022-12-02 | Disposition: A | Discharge status: Home / Self Care | Payer: Medicaid Other | Attending: Emergency Medicine | Admitting: Emergency Medicine

## 2022-12-02 ENCOUNTER — Encounter (HOSPITAL_COMMUNITY): Payer: Self-pay

## 2022-12-02 DIAGNOSIS — G43809 Other migraine, not intractable, without status migrainosus: Secondary | ICD-10-CM | POA: Diagnosis not present

## 2022-12-02 LAB — CBG MONITORING, ED: Glucose-Capillary: 121 mg/dL — ABNORMAL HIGH (ref 70–99)

## 2022-12-02 MED ORDER — IBUPROFEN 800 MG PO TABS
ORAL_TABLET | ORAL | Status: AC
  Filled 2022-12-02: qty 1

## 2022-12-02 MED ORDER — IBUPROFEN 800 MG PO TABS
800.0000 mg | ORAL_TABLET | Freq: Once | ORAL | Status: AC
  Administered 2022-12-02: 800 mg via ORAL

## 2022-12-02 NOTE — ED Provider Notes (Signed)
[facility]    CSN: [id number] Arrival date & time: 12/02/22  1010     History   Chief Complaint Chief Complaint  Patient presents with   Neck Pain   Eye Problem   Headache    HPI Heather Marsh is a 22 y.o. female.  Presents with 3-day history of headache, neck pain Reports neck pain has improved but headache persistent, 8/10 pain A little blurry vision Has not taken any medications  No fevers. No head or neck injury. Denies back pain. No weakness, numbness or tingling of the extremities  Reports history of migraine that feel like this  Mom is bedside, reports hx of elevated blood sugar. Prediabetes on chart review. A1c in August was 6.5  Past Medical History:  Diagnosis Date   Migraine    Sickle cell trait [facility])     Patient Active Problem List   Diagnosis Date Noted   Goiter 12/21/2020   Slow transit constipation 12/21/2020   Iron deficiency 12/21/2020   Absence of bladder continence 12/21/2020   Morbid childhood obesity with BMI greater than 99th percentile for age [facility]) 08/07/2017   Pre-diabetes 08/07/2017   Anovulatory cycle 08/07/2017   Menorrhagia with irregular cycle 08/07/2017    Past Surgical History:  Procedure Laterality Date   ADENOIDECTOMY      OB History     Gravida  0   Para  0   Term  0   Preterm  0   AB  0   Living  0      SAB  0   IAB  0   Ectopic  0   Multiple  0   Live Births  0            Home Medications    Prior to Admission medications   Medication Sig Start Date End Date Taking? Authorizing Provider  metFORMIN (GLUCOPHAGE XR) 500 MG 24 hr tablet Take 1 tablet (500 mg total) by mouth daily with breakfast for 14 days, THEN 2 tablets (1,000 mg total) daily with breakfast for 14 days. 12/21/20 01/18/21  Trude Mcburney, FNP  Ferrous Fumarate (HEMOCYTE - 106 MG FE) 324 (106 Fe) MG TABS tablet Take 1 tablet (106 mg of iron total) by mouth daily. 12/21/20 03/26/21  Trude Mcburney, FNP   norethindrone-ethinyl estradiol (JUNEL FE 1/20) 1-20 MG-MCG tablet Take 1 tablet by mouth daily. Patient not taking: Reported on 12/29/2019 12/09/19 01/09/20  Loura Halt A, NP  norgestrel-ethinyl estradiol (LO/OVRAL) 0.3-30 MG-MCG tablet Take 2 tablets by mouth daily. Until bleeding stops. Then take 1 tablet daily to finish pack. 12/29/19 01/09/20  Lelon Huh, MD    Family History Family History  Problem Relation Age of Onset   Sickle cell trait Mother    Anxiety disorder Mother    Migraines Brother    Allergic rhinitis Brother    Migraines Maternal Grandmother    Hypertension Maternal Grandmother    Heart disease Maternal Grandmother    Asthma Paternal Grandmother    Menstrual problems Sister    Allergic rhinitis Sister    Hypertension Maternal Grandfather    Hyperlipidemia Maternal Grandfather    Sickle cell trait Maternal Grandfather    Menstrual problems Paternal Aunt    Fibroids Paternal Aunt     Social History Social History   Tobacco Use   Smoking status: Passive Smoke Exposure - Never Smoker   Smokeless tobacco: Never   Tobacco comments:    smoking outside  Vaping Use  Vaping Use: Never used  Substance Use Topics   Alcohol use: No   Drug use: No     Allergies   Patient has no known allergies.   Review of Systems Review of Systems As per HPI  Physical Exam Triage Vital Signs ED Triage Vitals  Enc Vitals Group     BP 12/02/22 1138 124/89     Pulse Rate 12/02/22 1138 90     Resp 12/02/22 1138 16     Temp 12/02/22 1138 98 F (36.7 C)     Temp Source 12/02/22 1138 Oral     SpO2 12/02/22 1138 98 %     Weight --      Height --      Head Circumference --      Peak Flow --      Pain Score 12/02/22 1135 8     Pain Loc --      Pain Edu? --      Excl. in [place]? --    No data found.  Updated Vital Signs BP 124/89 (BP Location: Right Arm)   Pulse 90   Temp 98 F (36.7 C) (Oral)   Resp 16   LMP  (LMP Unknown)   SpO2 98%   Visual Acuity Right  Eye Distance: 20/40 Left Eye Distance: 20/30 Bilateral Distance: 20/30   Physical Exam Vitals and nursing note reviewed.  Constitutional:      General: She is not in acute distress. HENT:     Head: Normocephalic and atraumatic.     Right Ear: Tympanic membrane and ear canal normal.     Left Ear: Tympanic membrane and ear canal normal.     Nose: Nose normal.     Mouth/Throat:     Mouth: Mucous membranes are moist.     Pharynx: Oropharynx is clear.  Eyes:     Extraocular Movements: Extraocular movements intact.     Conjunctiva/sclera: Conjunctivae normal.     Pupils: Pupils are equal, round, and reactive to light.  Neck:     Trachea: Trachea normal.     Comments: Full ROM of neck. No meningeal signs Cardiovascular:     Rate and Rhythm: Normal rate and regular rhythm.     Heart sounds: Normal heart sounds.  Pulmonary:     Effort: Pulmonary effort is normal. No respiratory distress.     Breath sounds: Normal breath sounds.  Abdominal:     Palpations: Abdomen is soft.     Tenderness: There is no abdominal tenderness.  Musculoskeletal:        General: Normal range of motion.     Cervical back: Full passive range of motion without pain and normal range of motion. No pain with movement. Normal range of motion.  Lymphadenopathy:     Cervical: No cervical adenopathy.  Neurological:     General: No focal deficit present.     Mental Status: She is alert and oriented to person, place, and time.     Cranial Nerves: No cranial nerve deficit.     Sensory: Sensation is intact.     Motor: Motor function is intact. No weakness or tremor.     Coordination: Coordination is intact.     Gait: Gait is intact.     Comments: Strength 5/5 throughout. Sensation intact.     UC Treatments / Results  Labs (all labs ordered are listed, but only abnormal results are displayed) Labs Reviewed  CBG MONITORING, ED - Abnormal; Notable for the following components:  Result Value    Glucose-Capillary 121 (*)    All other components within normal limits    EKG   Radiology No results found.  Procedures Procedures (including critical care time)  Medications Ordered in UC Medications  ibuprofen (ADVIL) tablet 800 mg (800 mg Oral Given 12/02/22 1220)    Initial Impression / Assessment and Plan / UC Course  I have reviewed the triage vital signs and the nursing notes.  Pertinent labs & imaging results that were available during my care of the patient were reviewed by me and considered in my medical decision making (see chart for details).  CBG 121  Ibuprofen dose given, pain down to 4/10, patient no longer having visual symptoms.  She is well-appearing, alert and oriented, neuro exam intact.  Vitals are stable. Visual acuity normal  Presentation consistent with migraine.  Reassuring that ibuprofen does help her headache.  I recommend to continue alternating ibuprofen and Tylenol.  Close follow-up with primary care as needed.  Patient agrees to plan  Final Clinical Impressions(s) / UC Diagnoses   Final diagnoses:  Other migraine without status migrainosus, not intractable     Discharge Instructions      Please use ibuprofen up to 800 mg every 6 hours if needed for headache. You may need a few more days of medication to keep headache away. Please drink lots of fluids.  Follow up with your primary care provider regarding migraine and pre-diabetes     ED Prescriptions   None    PDMP not reviewed this encounter.   Jourdyn Hasler, [place], [place] 12/02/22 1305

## 2022-12-02 NOTE — Discharge Instructions (Addendum)
Please use ibuprofen up to 800 mg every 6 hours if needed for headache. You may need a few more days of medication to keep headache away. Please drink lots of fluids.  Follow up with your primary care provider regarding migraine and pre-diabetes

## 2022-12-02 NOTE — ED Triage Notes (Signed)
Pt is here for burry vision, headache and neck pain x 3days

## 2022-12-07 ENCOUNTER — Ambulatory Visit (HOSPITAL_COMMUNITY): Payer: Self-pay

## 2022-12-08 ENCOUNTER — Ambulatory Visit (HOSPITAL_COMMUNITY): Payer: Medicaid Other

## 2022-12-09 ENCOUNTER — Encounter (HOSPITAL_COMMUNITY): Payer: Self-pay | Admitting: *Deleted

## 2022-12-09 ENCOUNTER — Encounter (HOSPITAL_COMMUNITY): Payer: Self-pay

## 2022-12-09 ENCOUNTER — Ambulatory Visit (HOSPITAL_COMMUNITY)
Admission: EM | Admit: 2022-12-09 | Discharge: 2022-12-09 | Disposition: A | Discharge status: Home / Self Care | Payer: Medicaid Other

## 2022-12-09 ENCOUNTER — Emergency Department (HOSPITAL_COMMUNITY)
Admission: EM | Admit: 2022-12-09 | Discharge: 2022-12-09 | Disposition: A | Discharge status: Home / Self Care | Payer: Medicaid Other | Attending: Emergency Medicine | Admitting: Emergency Medicine

## 2022-12-09 ENCOUNTER — Other Ambulatory Visit: Payer: Self-pay

## 2022-12-09 ENCOUNTER — Emergency Department (HOSPITAL_COMMUNITY): Payer: Medicaid Other

## 2022-12-09 DIAGNOSIS — H47013 Ischemic optic neuropathy, bilateral: Secondary | ICD-10-CM | POA: Diagnosis not present

## 2022-12-09 DIAGNOSIS — G932 Benign intracranial hypertension: Secondary | ICD-10-CM

## 2022-12-09 DIAGNOSIS — H538 Other visual disturbances: Secondary | ICD-10-CM

## 2022-12-09 LAB — BASIC METABOLIC PANEL
Anion gap: 8 (ref 5–15)
BUN: 11 mg/dL (ref 6–20)
CO2: 26 mmol/L (ref 22–32)
Calcium: 9.1 mg/dL (ref 8.9–10.3)
Chloride: 102 mmol/L (ref 98–111)
Creatinine, Ser: 0.83 mg/dL (ref 0.44–1.00)
GFR, Estimated: >60 mL/min (ref >60)
Glucose, Bld: 105 mg/dL — ABNORMAL HIGH (ref 70–99)
Potassium: 3.6 mmol/L (ref 3.5–5.1)
Sodium: 136 mmol/L (ref 135–145)

## 2022-12-09 LAB — CBC WITH DIFFERENTIAL/PLATELET
Abs Immature Granulocytes: 0.02 10*3/uL (ref 0.00–0.07)
Basophils Absolute: 0.1 10*3/uL (ref 0.0–0.1)
Basophils Relative: 1 %
Eosinophils Absolute: 0.4 10*3/uL (ref 0.0–0.5)
Eosinophils Relative: 5 %
HCT: 34.4 % — ABNORMAL LOW (ref 36.0–46.0)
Hemoglobin: 9.6 g/dL — ABNORMAL LOW (ref 12.0–15.0)
Immature Granulocytes: 0 %
Lymphocytes Relative: 36 %
Lymphs Abs: 3 10*3/uL (ref 0.7–4.0)
MCH: 20.7 pg — ABNORMAL LOW (ref 26.0–34.0)
MCHC: 27.9 g/dL — ABNORMAL LOW (ref 30.0–36.0)
MCV: 74.1 fL — ABNORMAL LOW (ref 80.0–100.0)
Monocytes Absolute: 0.5 10*3/uL (ref 0.1–1.0)
Monocytes Relative: 6 %
Neutro Abs: 4.3 10*3/uL (ref 1.7–7.7)
Neutrophils Relative %: 52 %
Platelets: 388 10*3/uL (ref 150–400)
RBC: 4.64 MIL/uL (ref 3.87–5.11)
RDW: 17.2 % — ABNORMAL HIGH (ref 11.5–15.5)
WBC: 8.3 10*3/uL (ref 4.0–10.5)
nRBC: 0 % (ref 0.0–0.2)

## 2022-12-09 LAB — I-STAT BETA HCG BLOOD, ED (MC, WL, AP ONLY): I-stat hCG, quantitative: <5 m[IU]/mL (ref <5)

## 2022-12-09 MED ORDER — ACETAZOLAMIDE 250 MG PO TABS: 500.0000 mg | ORAL_TABLET | Freq: Two times a day (BID) | ORAL | 0 refills | Status: DC

## 2022-12-09 MED ORDER — GADOBUTROL 1 MMOL/ML IV SOLN
10.0000 mL | Freq: Once | INTRAVENOUS | Status: AC | PRN
  Administered 2022-12-09: 10 mL via INTRAVENOUS

## 2022-12-09 NOTE — ED Triage Notes (Signed)
Patient here for complaint of continued blurred vision for the last week, seen at urgent care and referred to ophthalmologist who referred patient to ED for evaluation. Patient is alert, oriented, ambulating independently with steady gait and is in no apparent distress at this time.

## 2022-12-09 NOTE — ED Provider Notes (Signed)
Heather Marsh    CSN: [id number] Arrival date & time: 12/09/22  1003      History   Chief Complaint Chief Complaint  Patient presents with   Blurred Vision   Diplopia    HPI Heather Marsh is a 22 y.o. female.   Patient was seen yesterday at [place], was diagnosed with ischemic optic neuropathy OU and was advised to see an opthoimologist that day, but they were closed by 4pm.   Reports blurred double vision bilaterally. Denies pain, reports throbbing sensation.   Denies cough, SOB, recent illness, eye discharge.   The history is provided by the patient.    Past Medical History:  Diagnosis Date   Migraine    Sickle cell trait [facility])     Patient Active Problem List   Diagnosis Date Noted   Goiter 12/21/2020   Slow transit constipation 12/21/2020   Iron deficiency 12/21/2020   Absence of bladder continence 12/21/2020   Morbid childhood obesity with BMI greater than 99th percentile for age [facility]) 08/07/2017   Pre-diabetes 08/07/2017   Anovulatory cycle 08/07/2017   Menorrhagia with irregular cycle 08/07/2017    Past Surgical History:  Procedure Laterality Date   ADENOIDECTOMY      OB History     Gravida  0   Para  0   Term  0   Preterm  0   AB  0   Living  0      SAB  0   IAB  0   Ectopic  0   Multiple  0   Live Births  0            Home Medications    Prior to Admission medications   Medication Sig Start Date End Date Taking? Authorizing Provider  metFORMIN (GLUCOPHAGE XR) 500 MG 24 hr tablet Take 1 tablet (500 mg total) by mouth daily with breakfast for 14 days, THEN 2 tablets (1,000 mg total) daily with breakfast for 14 days. 12/21/20 01/18/21  Trude Mcburney, FNP  Ferrous Fumarate (HEMOCYTE - 106 MG FE) 324 (106 Fe) MG TABS tablet Take 1 tablet (106 mg of iron total) by mouth daily. 12/21/20 03/26/21  Trude Mcburney, FNP  norethindrone-ethinyl estradiol (JUNEL FE 1/20) 1-20 MG-MCG tablet  Take 1 tablet by mouth daily. Patient not taking: Reported on 12/29/2019 12/09/19 01/09/20  Loura Halt A, NP  norgestrel-ethinyl estradiol (LO/OVRAL) 0.3-30 MG-MCG tablet Take 2 tablets by mouth daily. Until bleeding stops. Then take 1 tablet daily to finish pack. 12/29/19 01/09/20  Lelon Huh, MD    Family History Family History  Problem Relation Age of Onset   Sickle cell trait Mother    Anxiety disorder Mother    Migraines Brother    Allergic rhinitis Brother    Migraines Maternal Grandmother    Hypertension Maternal Grandmother    Heart disease Maternal Grandmother    Asthma Paternal Grandmother    Menstrual problems Sister    Allergic rhinitis Sister    Hypertension Maternal Grandfather    Hyperlipidemia Maternal Grandfather    Sickle cell trait Maternal Grandfather    Menstrual problems Paternal Aunt    Fibroids Paternal Aunt     Social History Social History   Tobacco Use   Smoking status: Passive Smoke Exposure - Never Smoker   Smokeless tobacco: Never   Tobacco comments:    smoking outside  Vaping Use   Vaping Use: Never used  Substance Use Topics  Alcohol use: No   Drug use: No     Allergies   Patient has no known allergies.   Review of Systems Review of Systems  Constitutional:  Negative for fever.  HENT:  Negative for sore throat.   Eyes:  Positive for visual disturbance. Negative for pain, discharge, redness and itching.  Respiratory:  Negative for shortness of breath.   Cardiovascular:  Negative for chest pain.  Neurological:  Negative for facial asymmetry, light-headedness, numbness and headaches.     Physical Exam Triage Vital Signs ED Triage Vitals  Enc Vitals Group     BP 12/09/22 1024 116/81     Pulse Rate 12/09/22 1024 81     Resp 12/09/22 1024 18     Temp 12/09/22 1024 98.3 F (36.8 C)     Temp src --      SpO2 12/09/22 1024 96 %     Weight --      Height --      Head Circumference --      Peak Flow --      Pain Score  12/09/22 1022 0     Pain Loc --      Pain Edu? --      Excl. in [place]? --    No data found.  Updated Vital Signs BP 116/81   Pulse 81   Temp 98.3 F (36.8 C)   Resp 18   LMP  (LMP Unknown)   SpO2 96%   Visual Acuity Right Eye Distance:   Left Eye Distance:   Bilateral Distance:    Right Eye Near:   Left Eye Near:    Bilateral Near:     Physical Exam Vitals and nursing note reviewed.  Constitutional:      Appearance: Normal appearance.     Comments: Pleasant 22 year old female who appears stated age.  HENT:     Head: Normocephalic and atraumatic. No right periorbital erythema or left periorbital erythema.     Right Ear: External ear normal.     Left Ear: External ear normal.     Nose: Nose normal.  Eyes:     General: Lids are normal. Visual field deficit present.        Right eye: No discharge.        Left eye: No discharge.     Extraocular Movements: Extraocular movements intact.     Right eye: No nystagmus.     Left eye: No nystagmus.     Conjunctiva/sclera: Conjunctivae normal.  Cardiovascular:     Rate and Rhythm: Normal rate and regular rhythm.     Pulses: Normal pulses.     Heart sounds: Normal heart sounds, S1 normal and S2 normal.  Pulmonary:     Effort: Pulmonary effort is normal. No respiratory distress.     Breath sounds: Normal breath sounds.     Comments: Lungs vesicular posteriorly. Neurological:     Mental Status: She is alert.      UC Treatments / Results  Labs (all labs ordered are listed, but only abnormal results are displayed) Labs Reviewed - No data to display  EKG   Radiology No results found.  Procedures Procedures (including critical care time)  Medications Ordered in UC Medications - No data to display  Initial Impression / Assessment and Plan / UC Course  I have reviewed the triage vital signs and the nursing notes.  Pertinent labs & imaging results that were available during my care of the patient were reviewed by  me  and considered in my medical decision making (see chart for details).  Patient diagnosed with ischemic optic neuropathy in an eye clinic yesterday.  Presents to urgent care today with continued diplopia.  Denies pain or eye discharge.  Discussed care with Opthomologist Wyatt Portela, who advised that patient needs to see opthomologist STAT, given patient Groat's phone number per Groat's request, he will arrange follow-up and further care.  Strict ER precautions given.  Patient verbalized understanding.     Final Clinical Impressions(s) / UC Diagnoses   Final diagnoses:  Ischemic optic neuropathy of both eyes     Discharge Instructions      You were seen yesterday and diagnosed with ischemic optic neuropathy and advised to see an ophthalmologist.  After getting in contact with our on-call ophthalmology team, Dr. Delton Prairie recommends that you text him directly to arrange follow-up.  His personal cell phone number is [phone].  If you experience any worsening of symptoms, or change in symptoms please text / call him or go to your nearest emergency room.     ED Prescriptions   None    PDMP not reviewed this encounter.   Azelea Seguin, [place],  12/09/22 [phone]

## 2022-12-09 NOTE — Discharge Instructions (Addendum)
You were seen yesterday and diagnosed with ischemic optic neuropathy and advised to see an ophthalmologist.  After getting in contact with our on-call ophthalmology team, Dr. Delton Prairie recommends that you text him directly to arrange follow-up.  His personal cell phone number is [phone].  If you experience any worsening of symptoms, or change in symptoms please text / call him or go to your nearest emergency room.

## 2022-12-09 NOTE — ED Provider Notes (Signed)
.  Lumbar Puncture  Date/Time: 12/09/2022 8:30 PM  Performed by: Godfrey Pick, MD Authorized by: Godfrey Pick, MD   Consent:    Consent obtained:  Verbal and written   Consent given by:  Patient   Risks, benefits, and alternatives were discussed: yes     Risks discussed:  Repeat procedure, pain, bleeding, infection and headache   Alternatives discussed:  No treatment and delayed treatment Universal protocol:    Procedure explained and questions answered to patient or proxy's satisfaction: yes     Patient identity confirmed:  Verbally with patient Pre-procedure details:    Procedure purpose:  Diagnostic   Preparation: Patient was prepped and draped in usual sterile fashion   Anesthesia:    Anesthesia method:  Local infiltration   Local anesthetic:  Lidocaine 1% w/o epi Procedure details:    Lumbar space:  L4-L5 interspace   Patient position:  Sitting   Needle gauge:  18   Needle type:  Spinal needle - Quincke tip   Needle length (in):  3.5   Ultrasound guidance: no     Number of attempts:  1   Opening pressure (cm H2O): >55.   Fluid appearance:  Clear   Total volume (ml):  10 Post-procedure details:    Puncture site:  Adhesive bandage applied   Procedure completion:  Tolerated well, no immediate complications     Godfrey Pick, MD 12/09/22 2031

## 2022-12-09 NOTE — ED Triage Notes (Signed)
Pt continues to have blurry vision and double vision fore 2 weeks. Pt had a eye exam yesterday and was told she had swollen optic nerve.

## 2022-12-09 NOTE — ED Provider Notes (Signed)
[facility] Provider Note   CSN: [id number] Arrival date & time: 12/09/22  1211     History  Chief Complaint  Patient presents with   Blurred Vision    Heather Marsh is a 22 y.o. female with Hx of morbid obesity, sickle cell trait, migraines, and prediabetes presenting to the ED with chief complaint of vision changes.  Describes blurry/double vision over the last 2 weeks.  Was fast/strong in onset, initially accompanied by headache and neck pain.  Headache and right-sided neck pain resolved several days ago, has not had this since.  Only continues to have the vision changes.  Denies neck stiffness, fevers, recent URI, N/V, lightheadedness, syncope, recent injury/trauma, eye infection/discharge, or confusion.  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    Went to urgent care today for same, still concerned of vision changes.  Was instructed to contact Dr. Wyatt Portela of ophthalmology directly to schedule a follow-up.  After talking with Dr. Katy Fitch via telephone/text, was recommended to come to the ED for further evaluation with neurology and imaging.   The history is provided by the patient and medical records.      Home Medications Prior to Admission medications   Medication Sig Start Date End Date Taking? Authorizing Provider  acetaZOLAMIDE (DIAMOX) 250 MG tablet Take 2 tablets (500 mg total) by mouth 2 (two) times daily. 12/09/22 01/08/23 Yes Prince Rome, PA-C  metFORMIN (GLUCOPHAGE XR) 500 MG 24 hr tablet Take 1 tablet (500 mg total) by mouth daily with breakfast for 14 days, THEN 2 tablets (1,000 mg total) daily with breakfast for 14 days. 12/21/20 01/18/21  Trude Mcburney, FNP  Ferrous Fumarate (HEMOCYTE - 106 MG FE) 324 (106 Fe) MG TABS tablet Take 1 tablet (106 mg of iron total) by mouth daily. 12/21/20 03/26/21  Trude Mcburney, FNP   norethindrone-ethinyl estradiol (JUNEL FE 1/20) 1-20 MG-MCG tablet Take 1 tablet by mouth daily. Patient not taking: Reported on 12/29/2019 12/09/19 01/09/20  Loura Halt A, NP  norgestrel-ethinyl estradiol (LO/OVRAL) 0.3-30 MG-MCG tablet Take 2 tablets by mouth daily. Until bleeding stops. Then take 1 tablet daily to finish pack. 12/29/19 01/09/20  Lelon Huh, MD      Allergies    Patient has no known allergies.    Review of Systems   Review of Systems  Eyes:  Positive for visual disturbance.    Physical Exam Updated Vital Signs BP 116/63   Pulse 89   Temp 98.7 F (37.1 C) (Oral)   Resp 17   LMP  (LMP Unknown)   SpO2 100%  Physical Exam Vitals and nursing note reviewed.  Constitutional:      General: She is not in acute distress.    Appearance: She is well-developed. She is not ill-appearing, toxic-appearing or diaphoretic.  HENT:     Head: Normocephalic and atraumatic.     Mouth/Throat:     Mouth: Mucous membranes are moist.     Pharynx: Oropharynx is clear.  Eyes:     General: Lids are normal. Gaze aligned appropriately. No scleral icterus.       Right eye: No foreign body, discharge or hordeolum.        Left eye: No foreign body, discharge or hordeolum.     Extraocular Movements: Extraocular movements intact.     Conjunctiva/sclera: Conjunctivae normal.     Right eye: Right conjunctiva is not injected. No chemosis, exudate  or hemorrhage.    Left eye: Left conjunctiva is not injected. No chemosis, exudate or hemorrhage.    Pupils: Pupils are equal, round, and reactive to light.  Neck:     Comments: No meningismus or torticollis Cardiovascular:     Rate and Rhythm: Normal rate and regular rhythm.     Heart sounds: No murmur heard. Pulmonary:     Effort: Pulmonary effort is normal. No respiratory distress.     Breath sounds: Normal breath sounds.  Abdominal:     Palpations: Abdomen is soft.     Tenderness: There is no abdominal tenderness.  Musculoskeletal:         General: No swelling.     Cervical back: Neck supple. No rigidity.  Skin:    General: Skin is warm and dry.     Capillary Refill: Capillary refill takes less than 2 seconds.     Coloration: Skin is not jaundiced or pale.  Neurological:     Mental Status: She is alert and oriented to person, place, and time.     GCS: GCS eye subscore is 4. GCS verbal subscore is 5. GCS motor subscore is 6.     Comments: No facial asymmetry.  Psychiatric:        Mood and Affect: Mood normal.     ED Results / Procedures / Treatments   Labs (all labs ordered are listed, but only abnormal results are displayed) Labs Reviewed  BASIC METABOLIC PANEL - Abnormal; Notable for the following components:      Result Value   Glucose, Bld 105 (*)    All other components within normal limits  CBC WITH DIFFERENTIAL/PLATELET - Abnormal; Notable for the following components:   Hemoglobin 9.6 (*)    HCT 34.4 (*)    MCV 74.1 (*)    MCH 20.7 (*)    MCHC 27.9 (*)    RDW 17.2 (*)    All other components within normal limits  I-STAT BETA HCG BLOOD, ED ([facility], [facility], [facility] ONLY)    EKG None  Radiology MR ANGIO HEAD WO CONTRAST  Result Date: 12/09/2022 CLINICAL DATA:  Neuro deficit, acute, stroke suspected.  Diplopia. EXAM: MRA HEAD WITHOUT CONTRAST TECHNIQUE: Angiographic images of the Circle of Willis were acquired using MRA technique without intravenous contrast. COMPARISON:  Same day MRI brain, MRA head and MRV head 12/09/2022. FINDINGS: Anterior circulation: The intracranial internal carotid arteries are patent. The M1 middle cerebral arteries are patent. No M2 proximal branch occlusion or high-grade proximal stenosis. The anterior cerebral arteries are patent. No intracranial aneurysm is identified. Posterior circulation: The intracranial vertebral arteries are patent. The basilar artery is patent. The posterior cerebral arteries are patent. Duplicated left posterior cerebral artery. The right posterior communicating  artery is diminutive or absent. Anatomic variants: As described. IMPRESSION: No intracranial large vessel occlusion or proximal high-grade arterial stenosis. Electronically Signed   By: Kellie Simmering D.O.   On: 12/09/2022 17:55   MR ORBITS W WO CONTRAST  Result Date: 12/09/2022 CLINICAL DATA:  Provided history: Diplopia. EXAM: MRI OF THE ORBITS WITHOUT AND WITH CONTRAST TECHNIQUE: Multiplanar, multi-echo pulse sequences of the orbits and surrounding structures were acquired including fat saturation techniques, before and after intravenous contrast administration. CONTRAST:  31m GADAVIST GADOBUTROL 1 MMOL/ML IV SOLN COMPARISON:  Today MRI brain, MRA head and MRV head 12/09/2022. FINDINGS: Intermittently motion degraded exam. Orbits: There is flattening of the posterior globes with intra-ocular protrusion of the optic nerve heads and enhancement of the optic  nerve heads bilaterally. Additionally, there is mild tortuosity of the optic nerves, predominantly on the left. No intraorbital mass. Visualized sinuses: Trace mucosal thickening within a left ethmoid air cell. Soft tissues: The visualized maxillofacial soft tissues are unremarkable. Limited intracranial: Intracranial contents evaluated on same day brain MRI. IMPRESSION: Intermittently motion degraded exam. Flattening of the posterior globes with intra-ocular protrusion of the optic nerve heads, and enhancement of the optic nerve heads, bilaterally. 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 Additionally, there is mild tortuosity of the optic nerves, predominately on the left. Consider idiopathic intracranial hypertension (pseudotumor cerebri). Electronically Signed   By: Kellie Simmering D.O.   On: 12/09/2022 17:50   MR MRV HEAD W WO CONTRAST  Result Date: 12/09/2022 CLINICAL DATA:  Provided history: Headache, intracranial hypertension features. Diplopia. EXAM: MR VENOGRAM HEAD WITHOUT AND WITH CONTRAST TECHNIQUE: Angiographic images of the intracranial  venous structures were acquired using MRV technique without and with intravenous contrast. CONTRAST:  16m GADAVIST GADOBUTROL 1 MMOL/ML IV SOLN COMPARISON:  Same day brain MRI, MRA head and MRI orbits. FINDINGS: The superior sagittal sinus, internal cerebral veins, vein of Galen, straight sinus, transverse sinuses, sigmoid sinuses and visualized jugular veins are patent. There is no appreciable intracranial venous thrombosis. Narrowed appearance of the distal transverse dural venous sinuses, bilaterally. IMPRESSION: 1. Narrowed appearance of the distal transverse dural venous sinuses, bilaterally. This finding can be seen in setting of idiopathic intracranial hypertension (pseudotumor cerebri) 2. No evidence of intracranial venous thrombosis. Electronically Signed   By: KKellie SimmeringD.O.   On: 12/09/2022 17:45   MR Brain W and Wo Contrast  Result Date: 12/09/2022 CLINICAL DATA:  Provided history: Diplopia. EXAM: MRI HEAD WITHOUT AND WITH CONTRAST TECHNIQUE: Multiplanar, multiecho pulse sequences of the brain and surrounding structures were obtained without and with intravenous contrast. CONTRAST:  111mGADAVIST GADOBUTROL 1 MMOL/ML IV SOLN COMPARISON:  Same-day MRA head, MRV head and MR orbits. FINDINGS: Brain: Cerebral volume is normal. Multifocal T2 FLAIR hyperintense signal abnormality within the cerebral white matter, mild but unexpected for age. Mild right cerebellar tonsillar ectopia with the right cerebellar tonsil extending 3-4 mm below the level of foramen magnum. This does not meet measurement criteria for a Chiari I malformation. There is no acute infarct. No evidence of an intracranial mass. No chronic intracranial blood products. No extra-axial fluid collection. No midline shift. No pathologic intracranial enhancement identified. Vascular: Maintained flow voids within the proximal large arterial vessels. Skull and upper cervical spine: No focal suspicious marrow lesion. Sinuses/Orbits: Orbits  separately reported on same day MRI of the orbits. No significant paranasal sinus disease. IMPRESSION: 1.  No evidence of an acute intracranial abnormality. 2. Multifocal T2 FLAIR hyperintense signal abnormality within the cerebral white matter, mild but unexpected for age. These signal changes are nonspecific and differential considerations include age advanced chronic small vessel ischemic disease, hypercoagulable state, vasculitis, sequelae of chronic migraine headaches, sequelae of a prior infectious/inflammatory process or sequelae of a demyelinating process (such as multiple sclerosis), among others. 3. No pathologic intracranial enhancement. 4. Mild right cerebellar tonsillar ectopia. Electronically Signed   By: KyKellie Simmering.O.   On: 12/09/2022 17:39    Procedures Procedures    Medications Ordered in ED Medications  gadobutrol (GADAVIST) 1 MMOL/ML injection 10 mL (10 mLs Intravenous Contrast Given 12/09/22 1625)    ED Course/ Medical Decision Making/ A&P Clinical Course as of 12/09/22 2033  Sat Dec 09, 2022  1229 Evaluated yesterday by StCorrie Mckusickff of [facility]  and was told optic nerves are swollen and to follow up with optho immediately. [AC]  1233 Was showed texts between pt's mother and Dr. Katy Fitch from today, which recommend going to ED for in-house neurology consult, MRI, and MRV to assess for possible dural venous sinus thrombosis.  If these are normal, then would recommend LP. [AC]  1241 Consulted Dr. Katy Fitch of ophthalmology.  He reports optometrist observed 20/50 vision and swollen optic nerves. Diplopia concerning for possible effect on CN VI.  Essentially recommending pseudotumor workup.  Neuro consult placed. [id number] Consulted with Dr. Leonel Ramsay of neurology.  Reviewed patient's case and recommendations of Dr. Katy Fitch in detail.  Reviewed images of optic nerves, which he agrees appear concerning.  He states he would like to discuss this further with Dr. Katy Fitch, phone number  provided, and plans to recontact with recommendations.  Also advises to continue with MR brain WWO, MRV head WWO, and MR orbits WWO. [AC]  90 MR MRV HEAD W WO CONTRAST Suggestive of idiopathic intracranial HTN [AC]  1750 MR Brain W and Wo Contrast Nonspecific findings of multifocal T2 FLAIR hyperintense signal abnormality within the cerebral white matter, mild but unexpected for age [AC]  24 MR ORBITS W WO CONTRAST Evidence of papilledema and idiopathic intracranial HTN [AC]  1809 MR ANGIO HEAD WO CONTRAST Negative for acute findings [AC]  1816 Reconsulted with Dr. Leonel Ramsay via secure chat, discussed imaging results.  Recommends proceeding with LP for opening pressure and therapeutic effect.  If opening pressure above normal, recommend starting Diamox 500 mg BID.  Regardless recommends close outpatient neurology and ophthalmology follow-up in 1 week.   [AC]    Clinical Course User Index [AC] Prince Rome, PA-C                             Medical Decision Making Amount and/or Complexity of Data Reviewed Labs: ordered. Radiology: ordered. Decision-making details documented in ED Course.  Risk Prescription drug management.   Patient is a 22 year old female with Hx of morbid obesity, sickle cell trait, migraines, and prediabetes presenting to the ED with chief complaint of vision changes.  Symptom onset 2 weeks ago.  Initially accompanied with headache and right-sided neck pain, however these resolved more than several days ago.  Seen by optometrist yesterday, recommended to urgently follow-up with ophthalmology and was diagnosed with ischemic optic neuropathy.  Appears to have been lost to follow-up.  Saw UC today, who recommended setting up follow-up ointment with Dr. Katy Fitch of ophthalmology.  After contacting Dr. Katy Fitch, was recommended to come to the ED for further evaluation and workup, including consult with neurology and MRI imaging, with consideration of LP.  On exam,  patient overall well-appearing.  Sitting comfortably.  Gaze aligned appropriately.  Subjective blurry and double vision on exam, which patient reports being a constant over the last 2 weeks.  Symptoms described as generally sudden in onset.  Consulted with ophthalmology's Dr. Katy Fitch and neurology's Dr. Leonel Ramsay  regarding patient case, see notes above.  Plan to proceed with MRI imaging and anticipate likely LP to follow to assess for pseudotumor cerebri versus cavernous venous thrombosis versus SAH.  MRIs pending.  Labs reviewed, mild anemia appreciated with Hgb 9.6 HCT 34.4, with MCV 74.1.  Known Hx of sickle cell trait.  MRV and MRI orbits with evidence of idiopathic intracranial hypertension and papilledema.  MRA unremarkable.  MRI brain with nonspecific findings, likely related to idiopathic intracranial  hypertension.  Reconsulted with neurology, see above.  Plan to proceed with LP, if pressures elevated plan to start on Diamox per neurology, however if pressure normal just continue with ophthalmology and neurology follow-up only.  LP procedure performed by attending Dr. Doren Custard, see separate note for details.  Patient tolerated this well without complication.  Confirmed diagnosis of idiopathic intracranial hypertension with pressure greater than 55 mmHg.  Plan for patient to start Diamox as requested by neurology, with close neurology and ophthalmology follow-up.  Postoperative LP instructions reviewed and provided in printed AVS, as well as idiopathic intracranial hypertension education.  Patient reports satisfaction with today's encounter.  Patient in NAD and good condition at time of discharge.    After consideration the patient's encounter today, I do not feel today's workup suggests an emergent condition requiring admission or immediate intervention beyond what has been performed at this time.  Safe for discharge; instructed to return immediately for worsening symptoms, change in symptoms or  any other concerns.  I have reviewed the patients home medicines and have made adjustments as needed.  Discussed course of treatment with the patient, whom demonstrated understanding.  Patient in agreement and has no further questions.    This chart was dictated using voice recognition software.  Despite best efforts to proofread,  errors can occur which can change the documentation meaning.         Final Clinical Impression(s) / ED Diagnoses Final diagnoses:  Idiopathic intracranial hypertension  Blurry vision    Rx / DC Orders ED Discharge Orders          Ordered    Ambulatory referral to Neurology       Comments: An appointment is requested in approximately: 1 week   12/09/22 1819    acetaZOLAMIDE (DIAMOX) 250 MG tablet  2 times daily        12/09/22 2027              Candace Cruise [id number]    Godfrey Pick, MD 12/12/22 [phone]

## 2022-12-09 NOTE — Consult Note (Signed)
Neurology Consultation Reason for Consult: Concern for pseudotumor Referring Physician: Doren Custard, R  CC: Concern for pseudotumor  History is obtained from: Patient  HPI: Heather Marsh is a 22 y.o. female with a history of migraine who remembers about 2 weeks ago she had abrupt onset of headache.  She states that it was maximal at onset.  It has been persistent since that time.  She has also had some blurring of her vision, and due to the blurring of her vision she saw an optometrist as an outpatient who did for endoscopy and found her to have bilateral papilledema.  He referred her to ophthalmology, but due to being the weekend, visual fields would not be available and therefore she was referred to the emergency department for evaluation of papilledema.  She brought with her her funduscopic pictures which clearly demonstrate significant papilledema bilaterally.   Past Medical History:  Diagnosis Date   Migraine    Sickle cell trait ([place])      Family History  Problem Relation Age of Onset   Sickle cell trait Mother    Anxiety disorder Mother    Migraines Brother    Allergic rhinitis Brother    Migraines Maternal Grandmother    Hypertension Maternal Grandmother    Heart disease Maternal Grandmother    Asthma Paternal Grandmother    Menstrual problems Sister    Allergic rhinitis Sister    Hypertension Maternal Grandfather    Hyperlipidemia Maternal Grandfather    Sickle cell trait Maternal Grandfather    Menstrual problems Paternal Aunt    Fibroids Paternal Aunt      Social History:  reports that she is a non-smoker but has been exposed to tobacco smoke. She has never used smokeless tobacco. She reports that she does not drink alcohol and does not use drugs.   Exam: Current vital signs: BP 102/89   Pulse 78   Temp 98.7 F (37.1 C) (Oral)   Resp 17   LMP  (LMP Unknown)   SpO2 100%  Vital signs in last 24 hours: Temp:  [98.3 F (36.8 C)-98.7 F (37.1 C)] 98.7 F (37.1 C)  (02/17 1222) Pulse Rate:  [78-86] 78 (02/17 1245) Resp:  [17-18] 17 (02/17 1222) BP: (102-118)/(81-107) 102/89 (02/17 1245) SpO2:  [96 %-100 %] 100 % (02/17 1245)   Physical Exam  Appears well-developed and well-nourished.   Neuro: Mental Status: Patient is awake, alert, oriented to person, place, month, year, and situation. Patient is able to give a clear and coherent history. No signs of aphasia or neglect Cranial Nerves: II: Visual Fields are full. Pupils are equal, round, and reactive to light.  Discs with papilledema III,IV, VI: EOMI without ptosis or diploplia.  V: Facial sensation is symmetric to temperature VII: Facial movement is symmetric.  VIII: hearing is intact to voice X: Uvula elevates symmetrically XI: Shoulder shrug is symmetric. XII: tongue is midline without atrophy or fasciculations.  Motor: Tone is normal. Bulk is normal. 5/5 strength was present in all four extremities.  Sensory: Sensation is symmetric to light touch and temperature in the arms and legs. Cerebellar: FNF intact bilaterally   I have reviewed labs in epic and the results pertinent to this consultation are: BMP-unremarkable   Impression: 22 year old female with bilateral papilledema and visual change.  The abruptness of her headache was unusual, and I would favor pursuing imaging.  The thunderclap onset does make me think that an MR angiogram would be prudent.  MR venogram to rule out venous sinus  thrombosis is also needed, as is MRI brain to rule out mass lesions.  Finally MRI orbits to further assess the optic nerves would be needed.  Recommendations: 1) MRI brain and orbits, MR angio head, MRV head 2) if negative, would pursue lumbar puncture for opening pressure, protein, cells, glucose 3) if opening pressure is above normal, I would start Diamox 500 mg twice daily with close outpatient ophthalmology follow-up for visual Fields as well as neurology follow-up for medication  titration.   Roland Rack, MD [facility] [phone]  If 7pm- 7am, please page neurology on call as listed in [place].

## 2022-12-09 NOTE — Discharge Instructions (Addendum)
You were evaluated in the emergency department for vision changes and intermittent headache.  Imaging today was suggestive of a condition known as pseudotumor cerebri, also known as idiopathic intracranial hypertension.  Further information regarding this diagnosis and post lumbar puncture care instructions have been provided for you to review.  A prescription for the Diamox has been sent to the pharmacy.  Take 500 mg AKA 2 capsules every 12 hours until you are able to follow-up with neurology next week.  It is important that you follow-up with both neurology and ophthalmology.  An internal referral has been placed on your behalf for neurology.  You should receive a phone call within 2 to 3 days to schedule your appointment, however if you have not received a phone call by Tuesday morning, please call to schedule the appointment.  You have also been provided the contact information for Dr. Carolynn Sayers of ophthalmology, please directly call their office to schedule the follow-up appointment.  Return to the ED for new or worsening symptoms as discussed.

## 2022-12-20 DIAGNOSIS — G932 Benign intracranial hypertension: Secondary | ICD-10-CM | POA: Insufficient documentation

## 2023-02-27 DIAGNOSIS — D500 Iron deficiency anemia secondary to blood loss (chronic): Secondary | ICD-10-CM | POA: Insufficient documentation

## 2023-03-28 ENCOUNTER — Ambulatory Visit (HOSPITAL_COMMUNITY)
Admission: RE | Admit: 2023-03-28 | Discharge: 2023-03-28 | Disposition: A | Discharge status: Home / Self Care | Payer: Medicaid Other | Source: Ambulatory Visit

## 2023-03-28 ENCOUNTER — Encounter (HOSPITAL_COMMUNITY): Payer: Self-pay

## 2023-03-28 VITALS — BP 110/75 | HR 112 | Temp 98.5°F | Resp 14

## 2023-03-28 DIAGNOSIS — J320 Chronic maxillary sinusitis: Secondary | ICD-10-CM

## 2023-03-28 MED ORDER — AMOXICILLIN-POT CLAVULANATE 875-125 MG PO TABS: 1.0000 | ORAL_TABLET | Freq: Two times a day (BID) | ORAL | 0 refills | Status: DC

## 2023-03-28 NOTE — ED Triage Notes (Signed)
Patient c/o nasal congestion and states, "I have pain on the bridge of my nose and now it is hurting under my eyes since February."   Patient states she has been taking a sinus pills and using a nsal spray.

## 2023-03-28 NOTE — ED Provider Notes (Signed)
[facility] CARE CENTER   [id number] 03/28/23 Arrival Time: 0803  ASSESSMENT & PLAN:  1. Chronic maxillary sinusitis    -Will treat empirically for chronic sinusitis.  Recommended nasal irrigation and showed the patient pictures of how to do this.  Will also call in Augmentin twice daily x 7 days.  This is likely still sequela of her idiopathic intracranial hypertension and recommended she follow-up with her providers at [facility] for longitudinal care.  Meds ordered this encounter  Medications   amoxicillin-clavulanate (AUGMENTIN) 875-125 MG tablet    Sig: Take 1 tablet by mouth every 12 (twelve) hours.    Dispense:  14 tablet    Refill:  0   Discharge Instructions   None       Reviewed expectations re: course of current medical issues. Questions answered. Outlined signs and symptoms indicating need for more acute intervention. Patient verbalized understanding. After Visit Summary given.   SUBJECTIVE: Pleasant 22 year old female with past medical history of idiopathic intracranial hypertension status post transverse sinus stenting in February comes in for sinus pressure.  She has a complex history of idiopathic intracranial hypertension that was affecting her vision and causing severe sinus pressure about 6 months ago.  She was seen at [facility] and diagnosed with idiopathic intracranial hypertension and underwent a transverse sinus stent that greatly improved her vision.  Her sinus pressure is also relieved a little bit but she still continues to have persistent pressure there.  She describes it constant ache along the bridge of her nose and in her maxillary sinuses bilaterally.  She has tried over-the-counter decongestants and nasal sprays but these have not helped.  She does occasionally cough up mucus and have sore throat.  No fevers.  No vision loss.  No LMP recorded. Patient has had an injection. Past Surgical History:  Procedure Laterality Date   ADENOIDECTOMY     STENT PLACEMENT  VASCULAR ([facility] HX)       OBJECTIVE:  Vitals:   03/28/23 0824  BP: 110/75  Pulse: (!) 112  Resp: 14  Temp: 98.5 F (36.9 C)  TempSrc: Oral  SpO2: 98%     Physical Exam Vitals and nursing note reviewed.  Constitutional:      Appearance: Normal appearance. She is not ill-appearing.  HENT:     Head: Normocephalic.     Right Ear: Tympanic membrane, ear canal and external ear normal.     Left Ear: Tympanic membrane, ear canal and external ear normal.     Nose: Congestion present.     Mouth/Throat:     Pharynx: No oropharyngeal exudate or posterior oropharyngeal erythema.  Eyes:     Extraocular Movements: Extraocular movements intact.  Cardiovascular:     Rate and Rhythm: Normal rate.  Pulmonary:     Effort: Pulmonary effort is normal.  Musculoskeletal:        General: Normal range of motion.  Lymphadenopathy:     Cervical: Cervical adenopathy present.  Skin:    General: Skin is warm.  Neurological:     General: No focal deficit present.  Psychiatric:        Mood and Affect: Mood normal.      Labs: Results for orders placed or performed during the hospital encounter of 12/09/22  Basic metabolic panel  Result Value Ref Range   Sodium 136 135 - 145 mmol/L   Potassium 3.6 3.5 - 5.1 mmol/L   Chloride 102 98 - 111 mmol/L   CO2 26 22 - 32 mmol/L   Glucose,  Bld 105 (H) 70 - 99 mg/dL   BUN 11 6 - 20 mg/dL   Creatinine, Ser 1.61 0.44 - 1.00 mg/dL   Calcium 9.1 8.9 - 09.6 mg/dL   GFR, Estimated >04 >54 mL/min   Anion gap 8 5 - 15  CBC with Differential  Result Value Ref Range   WBC 8.3 4.0 - 10.5 K/uL   RBC 4.64 3.87 - 5.11 MIL/uL   Hemoglobin 9.6 (L) 12.0 - 15.0 g/dL   HCT 09.8 (L) 11.9 - 14.7 %   MCV 74.1 (L) 80.0 - 100.0 fL   MCH 20.7 (L) 26.0 - 34.0 pg   MCHC 27.9 (L) 30.0 - 36.0 g/dL   RDW 82.9 (H) 56.2 - 13.0 %   Platelets 388 150 - 400 K/uL   nRBC 0.0 0.0 - 0.2 %   Neutrophils Relative % 52 %   Neutro Abs 4.3 1.7 - 7.7 K/uL   Lymphocytes Relative 36 %    Lymphs Abs 3.0 0.7 - 4.0 K/uL   Monocytes Relative 6 %   Monocytes Absolute 0.5 0.1 - 1.0 K/uL   Eosinophils Relative 5 %   Eosinophils Absolute 0.4 0.0 - 0.5 K/uL   Basophils Relative 1 %   Basophils Absolute 0.1 0.0 - 0.1 K/uL   Immature Granulocytes 0 %   Abs Immature Granulocytes 0.02 0.00 - 0.07 K/uL  I-Stat beta hCG blood, ED  Result Value Ref Range   I-stat hCG, quantitative <5.0 <5 mIU/mL   Comment 3           Labs Reviewed - No data to display  Imaging: No results found.   No Known Allergies                                             Past Medical History:  Diagnosis Date   Migraine    Sickle cell trait (HCC)     Social History   Socioeconomic History   Marital status: Single    Spouse name: Not on file   Number of children: Not on file   Years of education: Not on file   Highest education level: Not on file  Occupational History   Not on file  Tobacco Use   Smoking status: Never    Passive exposure: Yes   Smokeless tobacco: Never   Tobacco comments:    smoking outside  Vaping Use   Vaping Use: Never used  Substance and Sexual Activity   Alcohol use: No   Drug use: No   Sexual activity: Not Currently    Birth control/protection: None  Other Topics Concern   Not on file  Social History Narrative   Patient attends [employer] and is in 12th grade.  She reports to doing average in school.  She lives with her mother and brother.    Social Determinants of Health   Financial Resource Strain: Not on file  Food Insecurity: Not on file  Transportation Needs: Not on file  Physical Activity: Not on file  Stress: Not on file  Social Connections: Not on file  Intimate Partner Violence: Not on file    Family History  Problem Relation Age of Onset   Sickle cell trait Mother    Anxiety disorder Mother    Migraines Brother    Allergic rhinitis Brother    Migraines Maternal Grandmother    Hypertension  Maternal Grandmother    Heart disease  Maternal Grandmother    Asthma Paternal Grandmother    Menstrual problems Sister    Allergic rhinitis Sister    Hypertension Maternal Grandfather    Hyperlipidemia Maternal Grandfather    Sickle cell trait Maternal Grandfather    Menstrual problems Paternal Aunt    Fibroids Paternal Aunt       Ivanka Kirshner, Baldemar Friday, MD 03/28/23 0900

## 2023-04-24 ENCOUNTER — Encounter (HOSPITAL_COMMUNITY): Payer: Self-pay

## 2023-04-24 ENCOUNTER — Emergency Department (HOSPITAL_COMMUNITY): Payer: Medicaid Other

## 2023-04-24 ENCOUNTER — Emergency Department (HOSPITAL_COMMUNITY)
Admission: EM | Admit: 2023-04-24 | Discharge: 2023-04-24 | Disposition: A | Discharge status: Home / Self Care | Payer: Medicaid Other | Attending: Emergency Medicine | Admitting: Emergency Medicine

## 2023-04-24 ENCOUNTER — Other Ambulatory Visit: Payer: Self-pay

## 2023-04-24 DIAGNOSIS — R109 Unspecified abdominal pain: Secondary | ICD-10-CM | POA: Diagnosis not present

## 2023-04-24 DIAGNOSIS — N939 Abnormal uterine and vaginal bleeding, unspecified: Secondary | ICD-10-CM | POA: Diagnosis present

## 2023-04-24 HISTORY — DX: Anemia, unspecified: D64.9

## 2023-04-24 LAB — CBC WITH DIFFERENTIAL/PLATELET
Abs Immature Granulocytes: 0.05 10*3/uL (ref 0.00–0.07)
Basophils Absolute: 0.1 10*3/uL (ref 0.0–0.1)
Basophils Relative: 1 %
Eosinophils Absolute: 0.1 10*3/uL (ref 0.0–0.5)
Eosinophils Relative: 1 %
HCT: 40.3 % (ref 36.0–46.0)
Hemoglobin: 11.8 g/dL — ABNORMAL LOW (ref 12.0–15.0)
Immature Granulocytes: 1 %
Lymphocytes Relative: 20 %
Lymphs Abs: 1.8 10*3/uL (ref 0.7–4.0)
MCH: 23.3 pg — ABNORMAL LOW (ref 26.0–34.0)
MCHC: 29.3 g/dL — ABNORMAL LOW (ref 30.0–36.0)
MCV: 79.5 fL — ABNORMAL LOW (ref 80.0–100.0)
Monocytes Absolute: 0.4 10*3/uL (ref 0.1–1.0)
Monocytes Relative: 5 %
Neutro Abs: 6.9 10*3/uL (ref 1.7–7.7)
Neutrophils Relative %: 72 %
Platelets: 400 10*3/uL (ref 150–400)
RBC: 5.07 MIL/uL (ref 3.87–5.11)
WBC: 9.3 10*3/uL (ref 4.0–10.5)
nRBC: 0 % (ref 0.0–0.2)

## 2023-04-24 LAB — URINALYSIS, ROUTINE W REFLEX MICROSCOPIC
Glucose, UA: NEGATIVE mg/dL
Ketones, ur: NEGATIVE mg/dL
Nitrite: NEGATIVE
Protein, ur: 100 mg/dL — AB
Specific Gravity, Urine: 1.025 (ref 1.005–1.030)
pH: 6.5 (ref 5.0–8.0)

## 2023-04-24 LAB — COMPREHENSIVE METABOLIC PANEL
ALT: 28 U/L (ref 0–44)
AST: 19 U/L (ref 15–41)
Albumin: 3.7 g/dL (ref 3.5–5.0)
Alkaline Phosphatase: 54 U/L (ref 38–126)
Anion gap: 8 (ref 5–15)
BUN: 9 mg/dL (ref 6–20)
CO2: 16 mmol/L — ABNORMAL LOW (ref 22–32)
Calcium: 9.1 mg/dL (ref 8.9–10.3)
Chloride: 113 mmol/L — ABNORMAL HIGH (ref 98–111)
Creatinine, Ser: 0.91 mg/dL (ref 0.44–1.00)
GFR, Estimated: >60 mL/min (ref >60)
Glucose, Bld: 115 mg/dL — ABNORMAL HIGH (ref 70–99)
Potassium: 3.5 mmol/L (ref 3.5–5.1)
Sodium: 137 mmol/L (ref 135–145)
Total Bilirubin: 0.3 mg/dL (ref 0.3–1.2)
Total Protein: 7.2 g/dL (ref 6.5–8.1)

## 2023-04-24 LAB — URINALYSIS, MICROSCOPIC (REFLEX): RBC / HPF: >50 RBC/hpf (ref 0–5)

## 2023-04-24 LAB — WET PREP, GENITAL
Clue Cells Wet Prep HPF POC: NONE SEEN
Sperm: NONE SEEN
Trich, Wet Prep: NONE SEEN
WBC, Wet Prep HPF POC: <10 (ref <10)
Yeast Wet Prep HPF POC: NONE SEEN

## 2023-04-24 LAB — HCG, SERUM, QUALITATIVE: Preg, Serum: NEGATIVE

## 2023-04-24 LAB — PREGNANCY, URINE: Preg Test, Ur: NEGATIVE

## 2023-04-24 MED ORDER — KETOROLAC TROMETHAMINE 30 MG/ML IJ SOLN
30.0000 mg | Freq: Once | INTRAMUSCULAR | Status: AC
  Administered 2023-04-24: 30 mg via INTRAVENOUS
  Filled 2023-04-24: qty 1

## 2023-04-24 MED ORDER — FENTANYL CITRATE PF 50 MCG/ML IJ SOSY
50.0000 ug | PREFILLED_SYRINGE | INTRAMUSCULAR | Status: DC | PRN
  Administered 2023-04-24: 50 ug via INTRAVENOUS
  Filled 2023-04-24: qty 1

## 2023-04-24 MED ORDER — LACTATED RINGERS IV BOLUS
1000.0000 mL | Freq: Once | INTRAVENOUS | Status: AC
  Administered 2023-04-24: 1000 mL via INTRAVENOUS

## 2023-04-24 NOTE — ED Notes (Signed)
Pt is aware we need a urine sample and unable to provide a sample at this time.

## 2023-04-24 NOTE — Discharge Instructions (Addendum)
Please follow-up with your OB/GYN.  Take Tylenol 1000 mg every 6 hours you may use 400 mg of ibuprofen every 6-8 hours.  Make sure you are drinking plenty of water.  You may always return the emergency room for any new or concerning symptoms.

## 2023-04-24 NOTE — ED Triage Notes (Addendum)
Pt c/o lower abdominal cramping and vaginal bleeding x 2.5 weeks.  Pt reports irregular periods and started Depo shot in May.  Pain score 9/10.  Pt is on Brilinta and Aspirin 81mg .  Hx of anemia.

## 2023-04-25 LAB — GC/CHLAMYDIA PROBE AMP (~~LOC~~) NOT AT ARMC
Chlamydia: NEGATIVE
Comment: NEGATIVE
Comment: NORMAL
Neisseria Gonorrhea: NEGATIVE

## 2023-04-25 NOTE — ED Provider Notes (Signed)
[place] EMERGENCY DEPARTMENT AT [facility] Provider Note   CSN: [id number] Arrival date & time: 04/24/23  1015     History  Chief Complaint  Patient presents with   Vaginal Bleeding   Abdominal Pain    Barbarajo Jagger is a 22 y.o. female.   Vaginal Bleeding Associated symptoms: abdominal pain   Abdominal Pain Associated symptoms: vaginal bleeding   Patient is a 22 year old female with a past medical history significant for idiopathic intracranial hypertension on Diamox, anemia with hemoglobin as low as 8.6, goiter, migraine  Patient presents emergency room today with complaints of pain and vaginal bleeding for 2.5 weeks.  She states that she was recently started on the Depo shot for her vaginal bleeding this was done in May.  She states that her pain is constant and crampy in her lower abdomen.  Seems to be worse on the left side greater than right.  She is on Brilinta and aspirin she tells me that she has a stent in her brain but is not able to tell me why she has this.  She is not exactly certain why she is on dual antiplatelet therapy.  She states that she has been following with an OB/GYN at [facility] who has been helping her with her vaginal bleeding.  She states that she came to the ER today because of continued pain.  She denies any shortness of breath lightheadedness or dizziness.  No nausea or vomiting or diarrhea no urinary frequency urgency dysuria or hematuria.     Home Medications Prior to Admission medications   Medication Sig Start Date End Date Taking? Authorizing Provider  acetaZOLAMIDE (DIAMOX) 250 MG tablet Take 2 tablets (500 mg total) by mouth 2 (two) times daily. 12/09/22 01/08/23  Cecil Cobbs, PA-C  amoxicillin-clavulanate (AUGMENTIN) 875-125 MG tablet Take 1 tablet by mouth every 12 (twelve) hours. 03/28/23   Rafoth, Baldemar Friday, MD  metFORMIN (GLUCOPHAGE XR) 500 MG 24 hr tablet Take 1 tablet (500 mg total) by mouth daily with breakfast for 14 days,  THEN 2 tablets (1,000 mg total) daily with breakfast for 14 days. 12/21/20 01/18/21  Verneda Skill, FNP  potassium chloride (KLOR-CON) 10 MEQ tablet Take 10 mEq by mouth 2 (two) times daily.    [provider]  ticagrelor (BRILINTA) 90 MG TABS tablet Take by mouth 2 (two) times daily.    [provider]  Ferrous Fumarate (HEMOCYTE - 106 MG FE) 324 (106 Fe) MG TABS tablet Take 1 tablet (106 mg of iron total) by mouth daily. 12/21/20 03/26/21  Verneda Skill, FNP  norethindrone-ethinyl estradiol (JUNEL FE 1/20) 1-20 MG-MCG tablet Take 1 tablet by mouth daily. Patient not taking: Reported on 12/29/2019 12/09/19 01/09/20  Dahlia Byes A, NP  norgestrel-ethinyl estradiol (LO/OVRAL) 0.3-30 MG-MCG tablet Take 2 tablets by mouth daily. Until bleeding stops. Then take 1 tablet daily to finish pack. 12/29/19 01/09/20  Dessa Phi, MD      Allergies    Patient has no known allergies.    Review of Systems   Review of Systems  Gastrointestinal:  Positive for abdominal pain.  Genitourinary:  Positive for vaginal bleeding.    Physical Exam Updated Vital Signs BP 112/67   Pulse 88   Temp (!) 97 F (36.1 C)   Resp 18   Ht 5\' 3"  (1.6 m)   Wt 103 kg   SpO2 100%   BMI 40.21 kg/m  Physical Exam Vitals and nursing note reviewed.  Constitutional:  General: She is not in acute distress. HENT:     Head: Normocephalic and atraumatic.     Nose: Nose normal.  Eyes:     General: No scleral icterus. Cardiovascular:     Rate and Rhythm: Normal rate and regular rhythm.     Pulses: Normal pulses.     Heart sounds: Normal heart sounds.  Pulmonary:     Effort: Pulmonary effort is normal. No respiratory distress.     Breath sounds: No wheezing.  Abdominal:     Palpations: Abdomen is soft.     Tenderness: There is no abdominal tenderness.  Genitourinary:    Comments: Vulva without lesions or abnormality Vaginal canal with small amount of blood, without abnormal discharge or  lesion Cervix appears normal, is closed No adnexal tenderness or CMT Musculoskeletal:     Cervical back: Normal range of motion.     Right lower leg: No edema.     Left lower leg: No edema.  Skin:    General: Skin is warm and dry.     Capillary Refill: Capillary refill takes less than 2 seconds.  Neurological:     Mental Status: She is alert. Mental status is at baseline.  Psychiatric:        Mood and Affect: Mood normal.        Behavior: Behavior normal.     ED Results / Procedures / Treatments   Labs (all labs ordered are listed, but only abnormal results are displayed) Labs Reviewed  CBC WITH DIFFERENTIAL/PLATELET - Abnormal; Notable for the following components:      Result Value   Hemoglobin 11.8 (*)    MCV 79.5 (*)    MCH 23.3 (*)    MCHC 29.3 (*)    All other components within normal limits  COMPREHENSIVE METABOLIC PANEL - Abnormal; Notable for the following components:   Chloride 113 (*)    CO2 16 (*)    Glucose, Bld 115 (*)    All other components within normal limits  URINALYSIS, ROUTINE W REFLEX MICROSCOPIC - Abnormal; Notable for the following components:   Color, Urine AMBER (*)    APPearance CLOUDY (*)    Hgb urine dipstick LARGE (*)    Bilirubin Urine SMALL (*)    Protein, ur 100 (*)    Leukocytes,Ua TRACE (*)    All other components within normal limits  URINALYSIS, MICROSCOPIC (REFLEX) - Abnormal; Notable for the following components:   Bacteria, UA RARE (*)    All other components within normal limits  WET PREP, GENITAL  PREGNANCY, URINE  HCG, SERUM, QUALITATIVE  GC/CHLAMYDIA PROBE AMP ([place]) NOT AT [facility]    EKG None  Radiology US PELVIC COMPLETE W TRANSVAGINAL AND TORSION R/O  Result Date: 04/24/2023 CLINICAL DATA:  Vaginal bleeding. Left adnexal tenderness. Negative pregnancy test EXAM: TRANSABDOMINAL AND TRANSVAGINAL ULTRASOUND OF PELVIS TECHNIQUE: Both transabdominal and transvaginal ultrasound examinations of the pelvis were  performed. Transabdominal technique was performed for global imaging of the pelvis including uterus, ovaries, adnexal regions, and pelvic cul-de-sac. It was necessary to proceed with endovaginal exam following the transabdominal exam to visualize the adnexa and endometrium. COMPARISON:  02/05/2020 ultrasound FINDINGS: Uterus Measurements: 8.5 x 4.4 x 5.2 cm = volume: 101.3 mL. No fibroids or other mass visualized. Endometrium Thickness: 7 mm.  No focal abnormality visualized. Right ovary Measurements: 2.8 x 2.4 x 2.2 cm = volume: 7.8 mL. Normal appearance/no adnexal mass. Left ovary Measurements: 3.3 x 1.7 x 1.4 cm = volume: 4.2  mL. Normal appearance/no adnexal mass. Small cyst or dominant follicle measuring 18 mm. Other findings No abnormal free fluid. Preserved blood flow to the ovaries on color and spectral Doppler. IMPRESSION: Unremarkable pelvic ultrasound.  No free fluid Electronically Signed   By: Karen Kays M.D.   On: 04/24/2023 13:29    Procedures Procedures    Medications Ordered in ED Medications  lactated ringers bolus 1,000 mL (0 mLs Intravenous Stopped 04/24/23 1425)  ketorolac (TORADOL) 30 MG/ML injection 30 mg (30 mg Intravenous Given 04/24/23 1425)    ED Course/ Medical Decision Making/ A&P                             Medical Decision Making Amount and/or Complexity of Data Reviewed Radiology: ordered.  Risk Prescription drug management.   Patient is 22 year old female here with vaginal bleeding and pelvic pain.  Differential diagnosis includes, but is not limited to, threatened miscarriage, incomplete miscarriage, normal bleeding from an early trimester pregnancy, ectopic pregnancy, , blighted ovum, vaginal/cervical trauma, subchorionic hemorrhage/hematoma, etc.   Patient is well-appearing on exam.  No abdominal tenderness no guarding pelvic exam was uncomfortable but no focal discomfort.  Pelvic ultrasound without torsion or cyst.  CBC without clinically significant  anemia hemoglobin actually better than patient's baseline.  CMP unremarkable lipase within normal limits urinalysis without evidence of infection.  Wet prep unremarkable urine pregnancy negative  Patient with stable hemoglobin, well-appearing in no acute distress normal vital signs.  She feels improved after 1 dose of fentanyl and 1 dose of Toradol.  Recommend outpatient follow-up.  Return precautions discussed she will follow-up with her OB/GYN who she is established with [facility].  Final Clinical Impression(s) / ED Diagnoses Final diagnoses:  Vaginal bleeding    Rx / DC Orders ED Discharge Orders     None         Gailen Shelter, [place] 04/25/23 1046    Wynetta Fines, MD 04/25/23 1718

## 2023-04-27 ENCOUNTER — Encounter (INDEPENDENT_AMBULATORY_CARE_PROVIDER_SITE_OTHER): Payer: Self-pay

## 2023-06-28 ENCOUNTER — Encounter (HOSPITAL_COMMUNITY): Payer: Self-pay

## 2023-06-28 ENCOUNTER — Emergency Department (HOSPITAL_COMMUNITY): Payer: Medicaid Other

## 2023-06-28 ENCOUNTER — Other Ambulatory Visit: Payer: Self-pay

## 2023-06-28 ENCOUNTER — Emergency Department (HOSPITAL_COMMUNITY)
Admission: EM | Admit: 2023-06-28 | Discharge: 2023-06-28 | Disposition: A | Discharge status: Home / Self Care | Payer: Medicaid Other | Attending: Emergency Medicine | Admitting: Emergency Medicine

## 2023-06-28 DIAGNOSIS — I10 Essential (primary) hypertension: Secondary | ICD-10-CM | POA: Diagnosis not present

## 2023-06-28 DIAGNOSIS — R102 Pelvic and perineal pain: Secondary | ICD-10-CM | POA: Diagnosis present

## 2023-06-28 DIAGNOSIS — D72829 Elevated white blood cell count, unspecified: Secondary | ICD-10-CM | POA: Insufficient documentation

## 2023-06-28 DIAGNOSIS — Z7984 Long term (current) use of oral hypoglycemic drugs: Secondary | ICD-10-CM | POA: Insufficient documentation

## 2023-06-28 DIAGNOSIS — R7303 Prediabetes: Secondary | ICD-10-CM | POA: Diagnosis not present

## 2023-06-28 DIAGNOSIS — Z79899 Other long term (current) drug therapy: Secondary | ICD-10-CM | POA: Diagnosis not present

## 2023-06-28 LAB — CBC WITH DIFFERENTIAL/PLATELET
Abs Immature Granulocytes: 0.01 10*3/uL (ref 0.00–0.07)
Basophils Absolute: 0.1 10*3/uL (ref 0.0–0.1)
Basophils Relative: 1 %
Eosinophils Absolute: 0.4 10*3/uL (ref 0.0–0.5)
Eosinophils Relative: 6 %
HCT: 43 % (ref 36.0–46.0)
Hemoglobin: 13.4 g/dL (ref 12.0–15.0)
Immature Granulocytes: 0 %
Lymphocytes Relative: 42 %
Lymphs Abs: 2.6 10*3/uL (ref 0.7–4.0)
MCH: 27.1 pg (ref 26.0–34.0)
MCHC: 31.2 g/dL (ref 30.0–36.0)
MCV: 86.9 fL (ref 80.0–100.0)
Monocytes Absolute: 0.3 10*3/uL (ref 0.1–1.0)
Monocytes Relative: 5 %
Neutro Abs: 2.9 10*3/uL (ref 1.7–7.7)
Neutrophils Relative %: 46 %
Platelets: 312 10*3/uL (ref 150–400)
RBC: 4.95 MIL/uL (ref 3.87–5.11)
RDW: 18.6 % — ABNORMAL HIGH (ref 11.5–15.5)
WBC: 6.2 10*3/uL (ref 4.0–10.5)
nRBC: 0 % (ref 0.0–0.2)

## 2023-06-28 LAB — COMPREHENSIVE METABOLIC PANEL
ALT: 31 U/L (ref 0–44)
AST: 16 U/L (ref 15–41)
Albumin: 3.6 g/dL (ref 3.5–5.0)
Alkaline Phosphatase: 52 U/L (ref 38–126)
Anion gap: 6 (ref 5–15)
BUN: 10 mg/dL (ref 6–20)
CO2: 21 mmol/L — ABNORMAL LOW (ref 22–32)
Calcium: 9.2 mg/dL (ref 8.9–10.3)
Chloride: 111 mmol/L (ref 98–111)
Creatinine, Ser: 0.8 mg/dL (ref 0.44–1.00)
GFR, Estimated: >60 mL/min (ref >60)
Glucose, Bld: 120 mg/dL — ABNORMAL HIGH (ref 70–99)
Potassium: 3.4 mmol/L — ABNORMAL LOW (ref 3.5–5.1)
Sodium: 138 mmol/L (ref 135–145)
Total Bilirubin: 0.2 mg/dL — ABNORMAL LOW (ref 0.3–1.2)
Total Protein: 7.3 g/dL (ref 6.5–8.1)

## 2023-06-28 LAB — URINALYSIS, ROUTINE W REFLEX MICROSCOPIC
Bilirubin Urine: NEGATIVE
Glucose, UA: NEGATIVE mg/dL
Ketones, ur: NEGATIVE mg/dL
Nitrite: NEGATIVE
Protein, ur: NEGATIVE mg/dL
Specific Gravity, Urine: 1.017 (ref 1.005–1.030)
pH: 5 (ref 5.0–8.0)

## 2023-06-28 LAB — PREGNANCY, URINE: Preg Test, Ur: NEGATIVE

## 2023-06-28 MED ORDER — ONDANSETRON HCL 4 MG/2ML IJ SOLN
4.0000 mg | Freq: Once | INTRAMUSCULAR | Status: AC
  Administered 2023-06-28: 4 mg via INTRAVENOUS
  Filled 2023-06-28: qty 2

## 2023-06-28 MED ORDER — POTASSIUM CHLORIDE CRYS ER 20 MEQ PO TBCR
20.0000 meq | EXTENDED_RELEASE_TABLET | Freq: Once | ORAL | Status: AC
  Administered 2023-06-28: 20 meq via ORAL
  Filled 2023-06-28: qty 1

## 2023-06-28 MED ORDER — IOHEXOL 300 MG/ML  SOLN
100.0000 mL | Freq: Once | INTRAMUSCULAR | Status: AC | PRN
  Administered 2023-06-28: 100 mL via INTRAVENOUS

## 2023-06-28 MED ORDER — MORPHINE SULFATE (PF) 4 MG/ML IV SOLN
4.0000 mg | Freq: Once | INTRAVENOUS | Status: AC
  Administered 2023-06-28: 4 mg via INTRAVENOUS
  Filled 2023-06-28: qty 1

## 2023-06-28 NOTE — Discharge Instructions (Addendum)
Please follow-up with your OB/GYN regarding recent symptoms and ER visit.  Today your ultrasound and CT were reassuring however you may speak with your OB/GYN about possible prolactin level to see if pituitary pathology is causing her symptoms.  If symptoms change or worsen please return to ER.

## 2023-06-28 NOTE — ED Provider Notes (Signed)
Patient given in sign out by Jasmine Pang, PA-C.  Please review their note for patient HPI, physical exam, workup.  At this time the plan is follow-up on pelvic workup and dispo accordingly as this is most likely patient's chronic pelvic pain.  Ultrasound unremarkable currently awaiting CT scan.  By my personal read I do not note any abnormalities.  CT read came back unremarkable. I Spoke to the patient and her mom about the findings.  Patient is resting comfortably in the bed when I went to speak with them and was in no acute distress with stable vitals.  I recommended patient follow-up with her OB/GYN about recent ER visit and symptoms.  I recommended they speak to the OB/GYN about possible prolactinoma to see if pituitary is was causing patient's symptoms however patient not endorsing any lactation or other pituitary signs and symptoms besides the menorrhagia that we do not currently have an answer for.  Patient and mom verbalized understanding of this plan and acceptance.  Patient stable for discharge.    Heather Corrigan, PA-C 06/28/23 1711    Bethann Berkshire, MD 06/30/23 1059

## 2023-06-28 NOTE — ED Provider Notes (Signed)
[name]   CSN: [id number] Arrival date & time: 06/28/23  1054     History  Chief Complaint  Patient presents with   Pelvic Pain    Heather Marsh is Marsh 22 y.o. female with past medical history of prediabetes, anovulation, and iron deficiency anemia presenting to the emergency with over 1 month of pelvic pain. Reports pain Marsh 10/10.  Last menstrual period was August 7.  She is currently on Depo shot d/t heavy and highly sx periods. She is on Ticagrelor d/t stent and from intracranial hypertension surgery and thus has been recommended not to take NSAIDs. Pain has not taken anything for pain today. Reports she called Gyn office who recommended that she come here for ongoing pelvic pain. Pt reports recent pelvic exam and transvaginal US. Pt is not sexually active.    Pelvic Pain       Home Medications Prior to Admission medications   Medication Sig Start Date End Date Taking? Authorizing Provider  acetaZOLAMIDE (DIAMOX) 250 MG tablet Take 2 tablets (500 mg total) by mouth 2 (two) times daily. 12/09/22 01/08/23  Heather Cobbs, PA-C  amoxicillin-clavulanate (AUGMENTIN) 875-125 MG tablet Take 1 tablet by mouth every 12 (twelve) hours. 03/28/23   Rafoth, Baldemar Friday, MD  metFORMIN (GLUCOPHAGE XR) 500 MG 24 hr tablet Take 1 tablet (500 mg total) by mouth daily with breakfast for 14 days, THEN 2 tablets (1,000 mg total) daily with breakfast for 14 days. 12/21/20 01/18/21  Heather Skill, FNP  potassium chloride (KLOR-CON) 10 MEQ tablet Take 10 mEq by mouth 2 (two) times daily.    [provider]  ticagrelor (BRILINTA) 90 MG TABS tablet Take by mouth 2 (two) times daily.    [provider]  Ferrous Fumarate (HEMOCYTE - 106 MG FE) 324 (106 Fe) MG TABS tablet Take 1 tablet (106 mg of iron total) by mouth daily. 12/21/20 03/26/21  Heather Skill, FNP  norethindrone-ethinyl estradiol (JUNEL FE 1/20) 1-20 MG-MCG tablet Take  1 tablet by mouth daily. Patient not taking: Reported on 12/29/2019 12/09/19 01/09/20  Heather Byes A, NP  norgestrel-ethinyl estradiol (LO/OVRAL) 0.3-30 MG-MCG tablet Take 2 tablets by mouth daily. Until bleeding stops. Then take 1 tablet daily to finish pack. 12/29/19 01/09/20  Heather Phi, MD      Allergies    Patient has no known allergies.    Review of Systems   Review of Systems  Genitourinary:  Positive for pelvic pain.    Physical Exam Updated Vital Signs BP 136/89   Pulse 92   Temp 99 F (37.2 C) (Oral)   Resp 18   Ht 5\' 3"  (1.6 m)   Wt 102.5 kg   SpO2 100%   BMI 40.03 kg/m  Physical Exam Vitals and nursing note reviewed.  Constitutional:      General: She is not in acute distress.    Appearance: She is not toxic-appearing.  HENT:     Head: Normocephalic and atraumatic.  Eyes:     General: No scleral icterus.    Conjunctiva/sclera: Conjunctivae normal.  Cardiovascular:     Rate and Rhythm: Normal rate and regular rhythm.     Pulses: Normal pulses.     Heart sounds: Normal heart sounds.  Pulmonary:     Effort: Pulmonary effort is normal. No respiratory distress.     Breath sounds: Normal breath sounds.  Abdominal:     General: Abdomen is flat. Bowel sounds are  normal.     Palpations: Abdomen is soft.     Tenderness: There is abdominal tenderness.     Comments: suprapubic  Skin:    General: Skin is warm and dry.     Findings: No lesion.  Neurological:     General: No focal deficit present.     Mental Status: She is alert and oriented to person, place, and time. Mental status is at baseline.     ED Results / Procedures / Treatments   Labs (all labs ordered are listed, but only abnormal results are displayed) Labs Reviewed - No data to display  EKG None  Radiology No results found.  Procedures Procedures    Medications Ordered in ED Medications - No data to display  ED Course/ Medical Decision Making/ Marsh&P                                  Medical Decision Making Amount and/or Complexity of Data Reviewed Labs: ordered. Radiology: ordered.  Risk Prescription drug management.   Heather Marsh 21 y.o. presented today for abd pain. Working DDx includes, but not limited to, gastroenteritis, colitis, SBO, appendicitis, cholecystitis, hepatobiliary pathology, gastritis, PUD, ACS, dissection, pancreatitis, nephrolithiasis, AAA, UTI, pyelonephritis, ruptured ectopic pregnancy, PID, ovarian  R/o DDx: These are considered less likely than current impression due to history of present illness, physical exam, labs/imaging findings.  Review of prior external notes: OBGYN visit, prior transvaginal US   Pmhx:   Unique Tests and My Interpretation:  CBC: unremarkable  CMP: k 3.4  UA: mod leukocytes, 6-10 wbc, rare bacteria, mucus present, NEG nitrites, 21-50 rbc   Patient reports that she has had occasional spotting throughout the past month  Urine Pregnancy: NEG G&C, Wet prep: patient declined, not sexually active, never been sexually active and had very recent negative G&C, wet prep and pelvic exam @ OBGYN   Imaging:   PENDING -Pelvic US to evaluate for torsion  Ordered CT abd/pelvis due to blood in urine and continued abd pain   Problem List / ED Course / Critical interventions / Medication management  Pt reporting with pelvic pain for 1 month, she has had many prior episodes of pelvic pain and this feels similar, however, she reports she normally only experiences pelvic pain when she is on her period. She is not currently  on her period. Pt takes Depot, reports not sexually active. Will order labs, transvaginal US, and pelvic exam.  I ordered medication including morphine for pain, PO K  Reevaluation of the patient after these medicines showed that the patient improved Patients vitals assessed. Upon arrival patient is hemodynamically stable.  I have reviewed the patients home medicines and have made adjustments as  needed   Consult: NONE  Plan:  Patient signed off to oncoming ED PA provider    Ordered CT abd/pelis d/t abd pain and blood in urine -- pending Pelvic US -- -pending           Final Clinical Impression(s) / ED Diagnoses Final diagnoses:  None    Rx / DC Orders ED Discharge Orders     None         Smitty Knudsen, PA-C 06/28/23 1510    Loetta Rough, MD 06/29/23 [phone]

## 2023-06-28 NOTE — ED Notes (Signed)
Patient left prior to completion of the discharge process.  

## 2023-06-28 NOTE — ED Triage Notes (Signed)
Patient has had pelvic pain for 1 month. Her OBGYN stated to come get checked out. Stated her cycle has been longer normal. Is having period cramps even though she is currently not on her period now. Having light brown discharge.

## 2023-06-29 LAB — GC/CHLAMYDIA PROBE AMP (~~LOC~~) NOT AT ARMC
Chlamydia: NEGATIVE
Comment: NEGATIVE
Comment: NORMAL
Neisseria Gonorrhea: NEGATIVE

## 2023-08-20 ENCOUNTER — Other Ambulatory Visit: Payer: Self-pay

## 2023-08-20 ENCOUNTER — Emergency Department (HOSPITAL_COMMUNITY)
Admission: EM | Admit: 2023-08-20 | Discharge: 2023-08-20 | Disposition: A | Discharge status: Home / Self Care | Payer: Medicaid Other | Attending: Emergency Medicine | Admitting: Emergency Medicine

## 2023-08-20 ENCOUNTER — Emergency Department (HOSPITAL_BASED_OUTPATIENT_CLINIC_OR_DEPARTMENT_OTHER): Payer: Medicaid Other

## 2023-08-20 DIAGNOSIS — G8918 Other acute postprocedural pain: Secondary | ICD-10-CM | POA: Diagnosis present

## 2023-08-20 DIAGNOSIS — R590 Localized enlarged lymph nodes: Secondary | ICD-10-CM | POA: Insufficient documentation

## 2023-08-20 DIAGNOSIS — M79661 Pain in right lower leg: Secondary | ICD-10-CM | POA: Diagnosis not present

## 2023-08-20 NOTE — ED Provider Notes (Signed)
[place] EMERGENCY DEPARTMENT AT [facility] Provider Note   CSN: [id number] Arrival date & time: 08/20/23  4098     History  Chief Complaint  Patient presents with   Post-op Problem    Heather Marsh is a 22 y.o. female status post right venous sinus stent 08/09/2023 at [facility] presenting with concern that the femoral stent placement site was infected or had a blood clot.  Patient states that she has had no fevers, abdominal pain, nausea/vomiting, leg swelling, paresthesias, new onset weakness but notes that there is some bruising the area in which the stent was placed and notes that there is a hard ball noted in the inguinal crease.  Patient states that last time she was on blood thinners post stent surgery however was not placed on blood thinners this time and mom is concerned that a blood clot has formed.  Patient denies chest pain, shortness of breath, skin color changes, nausea/vomiting.  Patient states that since the surgery she still is her daily headaches from her intracranial idiopathic hypertension with no new features or vision changes.  Patient denies chest pain or shortness of breath.  Home Medications Prior to Admission medications   Medication Sig Start Date End Date Taking? Authorizing Provider  acetaZOLAMIDE (DIAMOX) 250 MG tablet Take 2 tablets (500 mg total) by mouth 2 (two) times daily. 12/09/22 01/08/23  Cecil Cobbs, PA-C  amoxicillin-clavulanate (AUGMENTIN) 875-125 MG tablet Take 1 tablet by mouth every 12 (twelve) hours. 03/28/23   Rafoth, Baldemar Friday, MD  metFORMIN (GLUCOPHAGE XR) 500 MG 24 hr tablet Take 1 tablet (500 mg total) by mouth daily with breakfast for 14 days, THEN 2 tablets (1,000 mg total) daily with breakfast for 14 days. 12/21/20 01/18/21  Verneda Skill, FNP  potassium chloride (KLOR-CON) 10 MEQ tablet Take 10 mEq by mouth 2 (two) times daily.    [provider]  ticagrelor (BRILINTA) 90 MG TABS tablet Take by mouth 2 (two) times  daily.    [provider]  Ferrous Fumarate (HEMOCYTE - 106 MG FE) 324 (106 Fe) MG TABS tablet Take 1 tablet (106 mg of iron total) by mouth daily. 12/21/20 03/26/21  Verneda Skill, FNP  norethindrone-ethinyl estradiol (JUNEL FE 1/20) 1-20 MG-MCG tablet Take 1 tablet by mouth daily. Patient not taking: Reported on 12/29/2019 12/09/19 01/09/20  Dahlia Byes A, NP  norgestrel-ethinyl estradiol (LO/OVRAL) 0.3-30 MG-MCG tablet Take 2 tablets by mouth daily. Until bleeding stops. Then take 1 tablet daily to finish pack. 12/29/19 01/09/20  Dessa Phi, MD      Allergies    Patient has no known allergies.    Review of Systems   Review of Systems  Physical Exam Updated Vital Signs BP (!) 126/101 (BP Location: Left Arm)   Pulse 97   Temp 98.3 F (36.8 C) (Oral)   Resp 18   Ht 5\' 3"  (1.6 m)   Wt 101.6 kg   SpO2 98%   BMI 39.68 kg/m  Physical Exam Constitutional:      General: She is not in acute distress. Cardiovascular:     Rate and Rhythm: Normal rate and regular rhythm.     Pulses: Normal pulses.  Abdominal:     General: There is no distension.     Palpations: Abdomen is soft.     Tenderness: There is no abdominal tenderness. There is no guarding or rebound.  Genitourinary:    Comments: Chaperone: Roberto, NT Right inguinal area: Ecchymosis noted without crepitus, erythema,  warmth, fluctuance, discharge, inguinal lymph nodes tender to palpation Musculoskeletal:     Comments: Right lower extremity: Does not appear edematous and is equal size to the left lower extremity 5 out of 5 bilateral hip flexion Pelvis stable and nontender Compartments soft and pain not out of proportion  Skin:    General: Skin is warm and dry.     Capillary Refill: Capillary refill takes less than 2 seconds.  Neurological:     Mental Status: She is alert.     Comments: Sensation intact distally     ED Results / Procedures / Treatments   Labs (all labs ordered are listed, but only abnormal  results are displayed) Labs Reviewed - No data to display  EKG None  Radiology VAS [place] LOWER EXTREMITY VENOUS (DVT) (7a-7p)  Result Date: 08/20/2023  Lower Venous DVT Study Patient Name:  Heather Marsh  Date of Exam:   08/20/2023 Medical Rec #: [id number]    Accession #:    [id number] Date of Birth: [birth year]    Patient Gender: F Patient Age:   79 years Exam Location:  [facility] Procedure:      VAS [place] LOWER EXTREMITY VENOUS (DVT) Referring Phys: Evlyn Kanner --------------------------------------------------------------------------------  Indications: Pain.  Risk Factors: Surgery Cranial stent placed 08/09/23 obesity. Comparison Study: No prior study Performing Technologist: Shona Simpson  Examination Guidelines: A complete evaluation includes B-mode imaging, spectral Doppler, color Doppler, and power Doppler as needed of all accessible portions of each vessel. Bilateral testing is considered an integral part of a complete examination. Limited examinations for reoccurring indications may be performed as noted. The reflux portion of the exam is performed with the patient in reverse Trendelenburg.  +---------+---------------+---------+-----------+----------+--------------+ RIGHT    CompressibilityPhasicitySpontaneityPropertiesThrombus Aging +---------+---------------+---------+-----------+----------+--------------+ CFV      Full           Yes      Yes                                 +---------+---------------+---------+-----------+----------+--------------+ SFJ      Full                                                        +---------+---------------+---------+-----------+----------+--------------+ FV Prox  Full                                                        +---------+---------------+---------+-----------+----------+--------------+ FV Mid   Full                                                         +---------+---------------+---------+-----------+----------+--------------+ FV DistalFull                                                        +---------+---------------+---------+-----------+----------+--------------+  PFV      Full                                                        +---------+---------------+---------+-----------+----------+--------------+ POP      Full           Yes      Yes                                 +---------+---------------+---------+-----------+----------+--------------+ PTV      Full                                                        +---------+---------------+---------+-----------+----------+--------------+ PERO     Full                                                        +---------+---------------+---------+-----------+----------+--------------+   +----+---------------+---------+-----------+----------+--------------+ LEFTCompressibilityPhasicitySpontaneityPropertiesThrombus Aging +----+---------------+---------+-----------+----------+--------------+ CFV Full           Yes      Yes                                 +----+---------------+---------+-----------+----------+--------------+     Summary: RIGHT: - There is no evidence of deep vein thrombosis in the lower extremity.  - No cystic structure found in the popliteal fossa.  LEFT: - No evidence of common femoral vein obstruction.   *See table(s) above for measurements and observations.    Preliminary     Procedures Procedures    Medications Ordered in ED Medications - No data to display  ED Course/ Medical Decision Making/ A&P                                 Medical Decision Making  Heather Marsh 22 y.o. presented today for bruising around femoral access site. Working DDx that I considered at this time includes, but not limited to, lymphadenopathy, ecchymosis, DVT, arterial emboli, sepsis, cellulitis, abscess.  R/o DDx: DVT, arterial emboli, sepsis,  cellulitis, abscess: These are considered less likely due to history of present illness, physical exam, labs/imaging findings  Review of prior external notes: 08/09/2023 admission  Unique Tests and My Interpretation:  Right lower extremity ultrasound: Unremarkable  Social Determinants of Health: none  Discussion with Independent Historian:  Mother  Discussion of Management of Tests: None  Risk: Low: based on diagnostic testing/clinical impression and treatment plan  Risk Stratification Score: None  Staffed with Trifan, MD  Plan: On exam patient was in no acute distress stable vitals.  Patient's was exam was remarkable for inguinal lymphadenopathy with some ecchymosis that was nontender on the right side however there were no concerning features that would be indicative of a blood clot, infection, life-threatening complication from the femoral access site.  I spoke to mom and patient about how this is  most likely lymphadenopathy secondary to having a stent placed however they wanted me to speak with the attending and so I spoke to the attending and we agreed to get an ultrasound to rule out any blood clot.  Ultrasound was negative and so patient and patient's mom were satisfied with this results.  I encouraged him to follow-up with the neurosurgeon to be reevaluated symptoms may change and patient was given return precautions.  Patient was given return precautions. Patient stable for discharge at this time.  Patient verbalized understanding of plan.  This chart was dictated using voice recognition software.  Despite best efforts to proofread,  errors can occur which can change the documentation meaning.         Final Clinical Impression(s) / ED Diagnoses Final diagnoses:  Post-operative pain  Lymphadenopathy, inguinal    Rx / DC Orders ED Discharge Orders     None         Remi Deter 08/20/23 1348    Terald Sleeper, MD 08/20/23 1407

## 2023-08-20 NOTE — ED Triage Notes (Signed)
Pt arrived via POV. Pt recently had cranial stent placed, and c/o painful lump in groin where line was inserted that began 5x days ago. No redness or drainage  AOx4

## 2023-08-20 NOTE — Progress Notes (Signed)
Lower extremity venous duplex completed. Please see CV Procedures for preliminary results.  Shona Simpson, RVT 08/20/23 1:02 PM

## 2023-08-20 NOTE — Discharge Instructions (Signed)
Please follow-up with your neurosurgeon in regards to recent symptoms and ER visit.  Today your ultrasound is negative for any blood clots in your physical exam is reassuring.  The lymph nodes you are feeling are most likely secondary to having a stent placed and your exam does not show any signs of infection.  If symptoms change or worsen please return to the ER.

## 2023-11-01 ENCOUNTER — Ambulatory Visit
Admission: EM | Admit: 2023-11-01 | Discharge: 2023-11-01 | Disposition: A | Discharge status: Home / Self Care | Payer: Medicaid Other | Attending: Physician Assistant | Admitting: Physician Assistant

## 2023-11-01 ENCOUNTER — Encounter: Payer: Self-pay | Admitting: Emergency Medicine

## 2023-11-01 DIAGNOSIS — R509 Fever, unspecified: Secondary | ICD-10-CM | POA: Insufficient documentation

## 2023-11-01 DIAGNOSIS — J069 Acute upper respiratory infection, unspecified: Secondary | ICD-10-CM

## 2023-11-01 LAB — POCT INFLUENZA A/B
Influenza A, POC: NEGATIVE
Influenza B, POC: NEGATIVE

## 2023-11-01 LAB — POCT RAPID STREP A (OFFICE): Rapid Strep A Screen: NEGATIVE

## 2023-11-01 NOTE — ED Triage Notes (Signed)
 Pt reports fever, body aches, and sore throat x1 day. Max temp: 103.7 this morning. Pt reports one episode of nausea and vomiting yesterday with small amount of blood in emesis. Only taking robitussin at home.

## 2023-11-01 NOTE — ED Provider Notes (Signed)
 [facility]    CSN: [id number] Arrival date & time: 11/01/23  9147      History   Chief Complaint Chief Complaint  Patient presents with   Fever   Generalized Body Aches   Sore Throat    HPI Heather Marsh is a 23 y.o. female.   Patient here today for evaluation of fever, body aches and sore throat that started yesterday.  She reports Tmax of 103.7 this morning.  She reports 1 episode of nausea and vomiting yesterday.  She has taken Robitussin at home without resolution.  The history is provided by the patient.  Fever Associated symptoms: congestion, cough, myalgias, nausea, sore throat and vomiting   Associated symptoms: no diarrhea and no ear pain   Sore Throat Pertinent negatives include no abdominal pain and no shortness of breath.    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 Authorizing Provider  aspirin EC 81 MG tablet Take by mouth. 08/10/23  Yes [provider]  oxyCODONE (OXY IR/ROXICODONE) 5 MG immediate release tablet Take by mouth. 08/10/23  Yes [provider]  ticagrelor (BRILINTA) 90 MG  TABS tablet Take by mouth 2 (two) times daily.   Yes [provider]  ticagrelor (BRILINTA) 90 MG TABS tablet Take by mouth. 10/18/23  Yes [provider]  acetaZOLAMIDE  (DIAMOX ) 250 MG tablet Take 2 tablets (500 mg total) by mouth 2 (two) times daily. Patient not taking: Reported on 11/01/2023 12/09/22 01/08/23  Renae Bernarda HERO, PA-C  acetaZOLAMIDE  ER (DIAMOX ) 500 MG capsule Take by mouth. Patient not taking: Reported on 11/01/2023 05/25/23   [provider]  amoxicillin  (AMOXIL ) 875 MG tablet Take by mouth. Patient not taking: Reported on 11/01/2023    [provider]  amoxicillin -clavulanate (AUGMENTIN ) 875-125 MG tablet Take 1 tablet by mouth every 12 (twelve) hours. Patient not taking: Reported on 11/01/2023 03/28/23   Rafoth, Katrinka ORN, MD  carboxymethylcellulose (REFRESH PLUS) 0.5 % SOLN Apply to eye. Patient not taking: Reported on 11/01/2023 08/10/23   [provider]  dexamethasone  (DECADRON ) 2 MG tablet Take by mouth. Patient not taking: Reported on 11/01/2023    [provider]  famotidine (PEPCID) 10 MG tablet Take 1 tablet by mouth 2 (two) times daily. Patient not taking: Reported on 11/01/2023 06/02/22   [provider]  ferrous sulfate 324 (65 Fe) MG TBEC Take by mouth. Patient not taking: Reported on 11/01/2023 02/05/23 02/05/24  [provider]  medroxyPROGESTERone (DEPO-PROVERA) 150 MG/ML injection Inject into the muscle. Patient not taking: Reported on 11/01/2023 02/22/23 02/17/24  [provider]  metFORMIN  (GLUCOPHAGE  XR) 500 MG 24 hr tablet Take 1 tablet (500 mg total) by mouth daily with breakfast for 14 days, THEN 2 tablets (1,000 mg total) daily with breakfast for 14 days. Patient not taking: Reported on 11/01/2023 12/21/20 01/18/21  Viviana Fitch T, FNP  pantoprazole (PROTONIX) 40 MG tablet Take 40 mg by mouth daily. Patient not taking: Reported on 11/01/2023 08/10/23   [provider]  polyethylene glycol  powder (GLYCOLAX /MIRALAX ) 17 GM/SCOOP powder Take by mouth. Patient not taking: Reported on 11/01/2023 12/22/22   [provider]  potassium chloride  (KLOR-CON  M) 10 MEQ tablet Take by mouth. Patient not taking: Reported on 11/01/2023 03/05/23 03/04/24  [provider]  potassium chloride  (KLOR-CON ) 10 MEQ tablet Take 10 mEq by mouth 2 (two) times daily. Patient not taking: Reported on 11/01/2023    [provider]  predniSONE  (DELTASONE ) 20 MG tablet Take 20 mg by mouth daily. Patient not taking: Reported on 11/01/2023 06/12/23   [provider]  senna-docusate (SENOKOT-S) 8.6-50 MG tablet Take by mouth. Patient not taking: Reported on 11/01/2023 12/22/22   [provider]  topiramate (TOPAMAX) 25 MG tablet Take 1 tablet by mouth 2 (two) times daily. Patient not taking: Reported on 11/01/2023 08/31/23 08/30/24  [provider]  traMADol (ULTRAM) 50 MG tablet Take 50 mg by mouth 3 (three) times daily as needed. Patient not taking: Reported on 11/01/2023 07/24/23   [provider]  Ferrous Fumarate  (HEMOCYTE - 106 MG FE) 324 (106 Fe) MG TABS tablet Take 1 tablet (106 mg of iron total) by mouth daily. 12/21/20 03/26/21  Viviana Fitch DASEN, FNP  norethindrone-ethinyl estradiol  (JUNEL FE 1/20) 1-20 MG-MCG tablet Take 1 tablet by mouth daily. Patient not taking: Reported on 12/29/2019 12/09/19 01/09/20  Adah Corning A, NP  norgestrel -ethinyl estradiol  (LO/OVRAL ) 0.3-30 MG-MCG tablet Take 2 tablets by mouth daily. Until bleeding stops. Then take 1 tablet daily to finish pack. 12/29/19 01/09/20  Dorrene Nest, MD    Family History Family History  Problem Relation Age of Onset   Sickle cell trait Mother    Anxiety disorder Mother    Migraines Brother    Allergic rhinitis Brother    Migraines Maternal Grandmother    Hypertension Maternal Grandmother    Heart disease Maternal Grandmother    Asthma Paternal Grandmother    Menstrual problems Sister    Allergic  rhinitis Sister    Hypertension Maternal Grandfather    Hyperlipidemia Maternal Grandfather    Sickle cell trait Maternal Grandfather    Menstrual problems Paternal Aunt    Fibroids Paternal Aunt     Social History Social History   Tobacco Use   Smoking status: Never    Passive exposure: Yes   Smokeless tobacco: Never   Tobacco comments:    smoking outside  Vaping Use   Vaping status: Never Used  Substance Use Topics   Alcohol use: No   Drug use: No     Allergies   Patient has no known allergies.   Review of Systems Review of Systems  Constitutional:  Positive for fever.  HENT:  Positive for congestion and sore throat. Negative for ear pain.   Eyes:  Negative for discharge and redness.  Respiratory:  Positive for cough. Negative for shortness of breath and wheezing.   Gastrointestinal:  Positive for nausea and vomiting. Negative for abdominal pain and diarrhea.  Musculoskeletal:  Positive for myalgias.     Physical Exam Triage Vital Signs ED Triage Vitals  Encounter Vitals Group     BP 11/01/23 0902 106/73     Systolic BP Percentile --      Diastolic BP Percentile --      Pulse Rate 11/01/23 0902 (!) 129     Resp 11/01/23 0902 18     Temp 11/01/23 0902 98.9 F (37.2 C)     Temp Source 11/01/23 0902 Oral     SpO2 11/01/23 0902 96 %     Weight --      Height --      Head Circumference --      Peak Flow --      Pain Score 11/01/23 0906 10     Pain Loc --      Pain Education --      Exclude from Growth Chart --    No data found.  Updated Vital Signs BP 106/73 (BP Location: Left Arm)   Pulse (!) 129   Temp 98.9 F (37.2 C) (Oral)   Resp 18   LMP  (LMP Unknown)   SpO2 96%   Visual Acuity Right Eye Distance:   Left Eye Distance:   Bilateral Distance:    Right Eye Near:   Left Eye Near:    Bilateral Near:     Physical Exam Vitals and nursing note reviewed.  Constitutional:      General: She is not in acute distress.    Appearance: Normal  appearance. She is not ill-appearing.  HENT:     Head: Normocephalic and atraumatic.     Right Ear: Tympanic membrane normal.     Left Ear: Tympanic membrane normal.     Nose: Congestion present.     Mouth/Throat:     Mouth: Mucous membranes are moist.     Pharynx: No oropharyngeal exudate or posterior oropharyngeal erythema.  Eyes:     Conjunctiva/sclera: Conjunctivae normal.  Cardiovascular:     Rate and Rhythm: Normal rate and regular rhythm.     Heart sounds: Normal heart sounds. No murmur heard. Pulmonary:     Effort: Pulmonary effort is normal. No respiratory distress.     Breath sounds: Normal breath sounds. No wheezing, rhonchi or rales.  Skin:    General: Skin is warm and dry.  Neurological:     Mental Status: She is alert.  Psychiatric:        Mood and Affect: Mood normal.        Thought Content: Thought content normal.      UC Treatments / Results  Labs (all labs ordered are listed, but only abnormal results are displayed) Labs Reviewed  POCT RAPID STREP A (OFFICE) - Normal  POCT INFLUENZA A/B - Normal  SARS CORONAVIRUS 2 (TAT 6-24 HRS)    EKG   Radiology No results found.  Procedures Procedures (including critical care time)  Medications Ordered in UC Medications - No data to display  Initial Impression / Assessment and Plan / UC Course  I have reviewed the triage vital signs and the nursing notes.  Pertinent labs & imaging results that were available during my care of the patient were reviewed by me and considered in my medical decision making (see chart for details).    Rapid strep and flu screening negative  in office.  Will order COVID screening.  Discussed likely viral etiology of symptoms and recommended symptomatic treatment, increase fluids and rest with follow-up if symptoms do not gradually improve with time or if they worsen in any way.  Final Clinical Impressions(s) / UC Diagnoses   Final diagnoses:  Acute upper respiratory infection    Discharge Instructions   None    ED Prescriptions   None    PDMP not reviewed this encounter.   Billy Asberry FALCON, PA-C 11/01/23 1015

## 2023-11-02 LAB — SARS CORONAVIRUS 2 (TAT 6-24 HRS): SARS Coronavirus 2: NEGATIVE

## 2023-11-27 ENCOUNTER — Emergency Department (HOSPITAL_COMMUNITY)
Admission: EM | Admit: 2023-11-27 | Discharge: 2023-11-27 | Discharge status: Against Medical Advice | Payer: Medicaid Other | Source: Home / Self Care

## 2023-11-27 ENCOUNTER — Ambulatory Visit
Admission: EM | Admit: 2023-11-27 | Discharge: 2023-11-27 | Disposition: A | Discharge status: Other Acute Inpatient Hospital | Payer: Medicaid Other

## 2023-11-27 ENCOUNTER — Encounter (HOSPITAL_COMMUNITY): Payer: Self-pay

## 2023-11-27 ENCOUNTER — Other Ambulatory Visit: Payer: Self-pay

## 2023-11-27 ENCOUNTER — Emergency Department (HOSPITAL_COMMUNITY)
Admission: EM | Admit: 2023-11-27 | Discharge: 2023-11-27 | Disposition: A | Discharge status: Home / Self Care | Payer: Medicaid Other

## 2023-11-27 ENCOUNTER — Emergency Department (HOSPITAL_COMMUNITY): Payer: Medicaid Other

## 2023-11-27 ENCOUNTER — Encounter: Payer: Self-pay | Admitting: Emergency Medicine

## 2023-11-27 DIAGNOSIS — R519 Headache, unspecified: Secondary | ICD-10-CM | POA: Diagnosis present

## 2023-11-27 DIAGNOSIS — Z7982 Long term (current) use of aspirin: Secondary | ICD-10-CM | POA: Diagnosis not present

## 2023-11-27 LAB — HCG, QUANTITATIVE, PREGNANCY: hCG, Beta Chain, Quant, S: 1 m[IU]/mL (ref <5)

## 2023-11-27 LAB — CBC
HCT: 49.1 % — ABNORMAL HIGH (ref 36.0–46.0)
Hemoglobin: 15.2 g/dL — ABNORMAL HIGH (ref 12.0–15.0)
MCH: 28.5 pg (ref 26.0–34.0)
MCHC: 31 g/dL (ref 30.0–36.0)
MCV: 91.9 fL (ref 80.0–100.0)
Platelets: 364 10*3/uL (ref 150–400)
RBC: 5.34 MIL/uL — ABNORMAL HIGH (ref 3.87–5.11)
RDW: 13.6 % (ref 11.5–15.5)
WBC: 6.9 10*3/uL (ref 4.0–10.5)
nRBC: 0 % (ref 0.0–0.2)

## 2023-11-27 LAB — BASIC METABOLIC PANEL
Anion gap: 13 (ref 5–15)
BUN: 9 mg/dL (ref 6–20)
CO2: 22 mmol/L (ref 22–32)
Calcium: 9.6 mg/dL (ref 8.9–10.3)
Chloride: 106 mmol/L (ref 98–111)
Creatinine, Ser: 0.84 mg/dL (ref 0.44–1.00)
GFR, Estimated: >60 mL/min (ref >60)
Glucose, Bld: 76 mg/dL (ref 70–99)
Potassium: 3.9 mmol/L (ref 3.5–5.1)
Sodium: 141 mmol/L (ref 135–145)

## 2023-11-27 MED ORDER — KETOROLAC TROMETHAMINE 15 MG/ML IJ SOLN
15.0000 mg | Freq: Once | INTRAMUSCULAR | Status: AC
  Administered 2023-11-27: 15 mg via INTRAVENOUS
  Filled 2023-11-27: qty 1

## 2023-11-27 MED ORDER — SODIUM CHLORIDE 0.9 % IV BOLUS
1000.0000 mL | Freq: Once | INTRAVENOUS | Status: AC
  Administered 2023-11-27: 1000 mL via INTRAVENOUS

## 2023-11-27 MED ORDER — ACETAMINOPHEN 500 MG PO TABS
1000.0000 mg | ORAL_TABLET | Freq: Once | ORAL | Status: AC
  Administered 2023-11-27: 1000 mg via ORAL
  Filled 2023-11-27: qty 2

## 2023-11-27 MED ORDER — GADOBUTROL 1 MMOL/ML IV SOLN: 9.0000 mL | Freq: Once | INTRAVENOUS | Status: DC | PRN

## 2023-11-27 MED ORDER — GADOBUTROL 1 MMOL/ML IV SOLN
9.0000 mL | Freq: Once | INTRAVENOUS | Status: AC | PRN
  Administered 2023-11-27: 9 mL via INTRAVENOUS

## 2023-11-27 MED ORDER — PROCHLORPERAZINE EDISYLATE 10 MG/2ML IJ SOLN
10.0000 mg | Freq: Once | INTRAMUSCULAR | Status: AC
  Administered 2023-11-27: 10 mg via INTRAVENOUS
  Filled 2023-11-27: qty 2

## 2023-11-27 NOTE — ED Provider Notes (Signed)
 [place] EMERGENCY DEPARTMENT AT [facility] Provider Note   CSN: [id number] Arrival date & time: 11/27/23  1002     History  Chief Complaint  Patient presents with   Eye Pain    Heather Marsh is a 23 y.o. female.  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  Reports headache to the posterior occiput.  Gradual onset.  Tried Excedrin with little improvement.  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  No facial droop, unilateral weakness, no neck stiffness, no nausea no vomiting.   Eye Pain       Home Medications Prior to Admission medications   Medication Sig Start Date End Date Taking? Authorizing Provider  aspirin EC 81 MG tablet Take by mouth. 08/10/23  Yes [provider]  Aspirin-Acetaminophen -Caffeine  (EXCEDRIN PO) Take 1 tablet by mouth as needed (headache).   Yes [provider]  carboxymethylcellulose (REFRESH PLUS) 0.5 % SOLN Apply to eye. 08/10/23  Yes [provider]  esomeprazole (NEXIUM) 20 MG capsule Take 20 mg by mouth daily at 12 noon.   Yes [provider]  etonogestrel (NEXPLANON) 68 MG IMPL implant 1 each by Subdermal route once.   Yes [provider]  potassium chloride  (KLOR-CON  M) 10 MEQ tablet Take by mouth. 03/05/23 03/04/24 Yes [provider]  ticagrelor (BRILINTA) 90 MG TABS tablet Take by mouth 2 (two) times daily.   Yes [provider]  Ferrous Fumarate  (HEMOCYTE - 106 MG FE) 324 (106 Fe) MG TABS tablet Take 1 tablet (106 mg of iron total) by mouth daily. 12/21/20 03/26/21  Viviana Aleck DASEN, FNP  norethindrone-ethinyl estradiol  (JUNEL FE 1/20) 1-20 MG-MCG tablet Take 1 tablet by mouth daily. Patient not taking: Reported on 12/29/2019 12/09/19 01/09/20  Adah Wilbert LABOR, FNP  norgestrel -ethinyl estradiol  (LO/OVRAL ) 0.3-30 MG-MCG tablet Take 2 tablets by mouth daily.  Until bleeding stops. Then take 1 tablet daily to finish pack. 12/29/19 01/09/20  Dorrene Nest, MD      Allergies    Patient has no known allergies.    Review of Systems   Review of Systems  Eyes:  Positive for pain.    Physical Exam Updated Vital Signs BP 106/75   Pulse 72   Temp 98.9 F (37.2 C)   Resp 19   Ht 5' 3 (1.6 m)   Wt 93 kg   LMP  (LMP Unknown)   SpO2 98%   BMI 36.31 kg/m  Physical Exam Vitals and nursing note reviewed.  Constitutional:      General: She is not in acute distress.    Appearance: She is obese. She is not toxic-appearing.  HENT:     Mouth/Throat:     Mouth: Mucous membranes are moist.  Eyes:     Extraocular Movements: Extraocular movements intact.     Conjunctiva/sclera: Conjunctivae normal.     Pupils: Pupils are equal, round, and reactive to light.  Cardiovascular:     Rate and Rhythm: Normal rate and regular rhythm.  Pulmonary:     Effort: Pulmonary effort is normal.     Breath sounds: Normal breath sounds.  Abdominal:     General: Abdomen is flat. There is no distension.     Palpations: Abdomen is soft.     Tenderness: There is no abdominal tenderness. There is no guarding or rebound.  Musculoskeletal:        General: Normal range of motion.  Cervical back: Normal range of motion.  Skin:    General: Skin is warm and dry.     Capillary Refill: Capillary refill takes less than 2 seconds.  Neurological:     General: No focal deficit present.     Mental Status: She is alert and oriented to person, place, and time.     Cranial Nerves: No cranial nerve deficit.     Sensory: No sensory deficit.     Motor: No weakness.     Coordination: Coordination normal.  Psychiatric:        Mood and Affect: Mood normal.        Behavior: Behavior normal.     ED Results / Procedures / Treatments   Labs (all labs ordered are listed, but only abnormal results are displayed) Labs Reviewed  CBC  BASIC METABOLIC PANEL  HCG, QUANTITATIVE,  PREGNANCY    EKG None  Radiology No results found.  Procedures Procedures    Medications Ordered in ED Medications  gadobutrol  (GADAVIST ) 1 MMOL/ML injection 9 mL (has no administration in time range)  ketorolac  (TORADOL ) 15 MG/ML injection 15 mg (15 mg Intravenous Given 11/27/23 1228)  prochlorperazine  (COMPAZINE ) injection 10 mg (10 mg Intravenous Given 11/27/23 1228)  acetaminophen  (TYLENOL ) tablet 1,000 mg (1,000 mg Oral Given 11/27/23 1227)  sodium chloride  0.9 % bolus 1,000 mL (1,000 mLs Intravenous New Bag/Given 11/27/23 1227)  gadobutrol  (GADAVIST ) 1 MMOL/ML injection 9 mL (9 mLs Intravenous Contrast Given 11/27/23 1307)    ED Course/ Medical Decision Making/ A&P Clinical Course as of 11/27/23 1535  Tue Nov 27, 2023  1201 Ad[facility] to [facility]/22 for IIH, per discharge summary : Crystalann Korf was admitted to the hospital on 12/19/2022 in the care of the Neurosurgery team for further evaluation and management.   The patient was taken to the angio suite and underwent an diagnostic radiographic cerebral angiogram on 12/20/22. The surgery was performed by Dr. Selwyn Lazier and assisted by Dr. Fairy Penning. 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 There were no intraoperative complications. Patient tolerated the procedure well and was transferred to the neurosurgery stepdown unit in a stable condition.   The patient underwent a Fluoroscopic guided lumbar puncture on 12/20/22 which revealed opening pressure of 55 cm H2O. There were no procedural complications.   The patient was started on aspirin and Brilinta. She was taken to the angio suite on 12/21/22 and underwent right transverse venous sinus stenting. The procedure was performed by Dr. Selwyn Lazier, assisted by Dr. Fairy Penning. There were not interoperative complications and the patient returned to the Neuro Stepdown unit in a hemodynamically stable  condition.   [TY]  1201 Spoke with radiology regarding best imaging modality to evaluate her stenting.  Recommending MRI brain with and without contrast [TY]  1520 Patient reevaluated, is feeling improved after migraine cocktail. [TY]    Clinical Course User Index [TY] Neysa Caron PARAS, DO                                 Medical Decision Making This is a 23 year old female with idiopathic intracranial hypertension status post venous shunting at [facility] presenting emergency department for headache.  Exam largely reassuring with no localizing deficits. Chart reviewed; see ED course.  Given migraine cocktail with improvement of symptoms. Awaiting MRI results.   Amount and/or Complexity of  Data Reviewed Independent Historian:     Details: Mother notes chronic vision changes and has appointment soon with headache specialist.  Labs: ordered. Radiology: ordered.  Risk OTC drugs. Prescription drug management. Decision regarding hospitalization.          Final Clinical Impression(s) / ED Diagnoses Final diagnoses:  None    Rx / DC Orders ED Discharge Orders     None         Neysa Caron PARAS, DO 11/27/23 1535

## 2023-11-27 NOTE — ED Notes (Signed)
 Patient transported to MRI

## 2023-11-27 NOTE — ED Triage Notes (Addendum)
Pt states back of head has been hurting and states vision has been "shaky and jumpy". Hx of migraines. Pt states body feels tingly. Pt states she can not see out of peripherals at baseline. Pt states vision problems since last year. Hx of IIC

## 2023-11-27 NOTE — ED Provider Notes (Incomplete)
[facility]    CSN: [id number] Arrival date & time: 11/27/23  0813      History   Chief Complaint Chief Complaint  Patient presents with   Headache    HPI Heather Marsh is a 23 y.o. female.    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 Authorizing Provider  acetaZOLAMIDE (DIAMOX) 250 MG tablet Take 2 tablets (500 mg total) by mouth 2 (two) times daily. Patient not taking: Reported on 11/01/2023 12/09/22 01/08/23  Cecil Cobbs, PA-C  acetaZOLAMIDE ER (DIAMOX) 500 MG capsule Take by mouth. Patient not taking: Reported on 11/01/2023 05/25/23   [provider]  amoxicillin (AMOXIL) 875 MG tablet Take by mouth. Patient not taking: Reported on 11/01/2023    [provider]  amoxicillin-clavulanate (AUGMENTIN) 875-125 MG tablet Take 1 tablet by mouth every 12 (twelve) hours. Patient not taking: Reported on 11/01/2023 03/28/23   Rafoth, Baldemar Friday, MD  aspirin EC 81 MG tablet Take by mouth. 08/10/23   [provider]  carboxymethylcellulose (REFRESH PLUS) 0.5 % SOLN Apply to eye. Patient not taking:  Reported on 11/01/2023 08/10/23   [provider]  dexamethasone (DECADRON) 2 MG tablet Take by mouth. Patient not taking: Reported on 11/01/2023    [provider]  famotidine (PEPCID) 10 MG tablet Take 1 tablet by mouth 2 (two) times daily. Patient not taking: Reported on 11/01/2023 06/02/22   [provider]  ferrous sulfate 324 (65 Fe) MG TBEC Take by mouth. Patient not taking: Reported on 11/01/2023 02/05/23 02/05/24  [provider]  medroxyPROGESTERone (DEPO-PROVERA) 150 MG/ML injection Inject into the muscle. Patient not taking: Reported on 11/01/2023 02/22/23 02/17/24  [provider]  metFORMIN (GLUCOPHAGE XR) 500 MG 24 hr tablet Take 1 tablet (500 mg total) by mouth daily with breakfast for 14 days, THEN 2 tablets (1,000 mg total) daily with breakfast for 14 days. Patient not taking: Reported on 11/01/2023 12/21/20 01/18/21  Alfonso Ramus T, FNP  oxyCODONE (OXY IR/ROXICODONE) 5 MG immediate release tablet Take by mouth. 08/10/23   [provider]  pantoprazole (PROTONIX) 40 MG tablet Take 40 mg by mouth daily. Patient not taking: Reported on 11/01/2023 08/10/23   [provider]  polyethylene glycol powder (GLYCOLAX/MIRALAX) 17 GM/SCOOP powder Take by mouth. Patient not taking: Reported  on 11/01/2023 12/22/22   [provider]  potassium chloride (KLOR-CON M) 10 MEQ tablet Take by mouth. Patient not taking: Reported on 11/01/2023 03/05/23 03/04/24  [provider]  potassium chloride (KLOR-CON) 10 MEQ tablet Take 10 mEq by mouth 2 (two) times daily. Patient not taking: Reported on 11/01/2023    [provider]  predniSONE (DELTASONE) 20 MG tablet Take 20 mg by mouth daily. Patient not taking: Reported on 11/01/2023 06/12/23   [provider]  senna-docusate (SENOKOT-S) 8.6-50 MG tablet Take by mouth. Patient not taking: Reported on 11/01/2023 12/22/22   [provider]  ticagrelor (BRILINTA) 90 MG TABS tablet  Take by mouth 2 (two) times daily.    [provider]  ticagrelor (BRILINTA) 90 MG TABS tablet Take by mouth. 10/18/23   [provider]  topiramate (TOPAMAX) 25 MG tablet Take 1 tablet by mouth 2 (two) times daily. Patient not taking: Reported on 11/01/2023 08/31/23 08/30/24  [provider]  traMADol (ULTRAM) 50 MG tablet Take 50 mg by mouth 3 (three) times daily as needed. Patient not taking: Reported on 11/01/2023 07/24/23   [provider]  Ferrous Fumarate (HEMOCYTE - 106 MG FE) 324 (106 Fe) MG TABS tablet Take 1 tablet (106 mg of iron total) by mouth daily. 12/21/20 03/26/21  Verneda Skill, FNP  norethindrone-ethinyl estradiol (JUNEL FE 1/20) 1-20 MG-MCG tablet Take 1 tablet by mouth daily. Patient not taking: Reported on 12/29/2019 12/09/19 01/09/20  Janace Aris, FNP  norgestrel-ethinyl estradiol (LO/OVRAL) 0.3-30 MG-MCG tablet Take 2 tablets by mouth daily. Until bleeding stops. Then take 1 tablet daily to finish pack. 12/29/19 01/09/20  Dessa Phi, MD    Family History Family History  Problem Relation Age of Onset   Sickle cell trait Mother    Anxiety disorder Mother    Migraines Brother    Allergic rhinitis Brother    Migraines Maternal Grandmother    Hypertension Maternal Grandmother    Heart disease Maternal Grandmother    Asthma Paternal Grandmother    Menstrual problems Sister    Allergic rhinitis Sister    Hypertension Maternal Grandfather    Hyperlipidemia Maternal Grandfather    Sickle cell trait Maternal Grandfather    Menstrual problems Paternal Aunt    Fibroids Paternal Aunt     Social History Social History   Tobacco Use   Smoking status: Never    Passive exposure: Yes   Smokeless tobacco: Never   Tobacco comments:    smoking outside  Vaping Use   Vaping status: Never Used  Substance Use Topics   Alcohol use: No   Drug use: No     Allergies   Patient has no known allergies.   Review of Systems Review of Systems   Neurological:  Positive for headaches.     Physical Exam Triage Vital Signs ED Triage Vitals [11/27/23 0915]  Encounter Vitals Group     BP 126/86     Systolic BP Percentile      Diastolic BP Percentile      Pulse Rate 85     Resp 18     Temp 98.3 F (36.8 C)     Temp Source Oral     SpO2 98 %     Weight      Height      Head Circumference      Peak Flow      Pain Score 8     Pain Loc  Pain Education      Exclude from Growth Chart    No data found.  Updated Vital Signs BP 126/86 (BP Location: Left Arm)   Pulse 85   Temp 98.3 F (36.8 C) (Oral)   Resp 18   LMP  (LMP Unknown)   SpO2 98%   Visual Acuity Right Eye Distance:   Left Eye Distance:   Bilateral Distance:    Right Eye Near:   Left Eye Near:    Bilateral Near:     Physical Exam   UC Treatments / Results  Labs (all labs ordered are listed, but only abnormal results are displayed) Labs Reviewed - No data to display  EKG   Radiology No results found.  Procedures Procedures (including critical care time)  Medications Ordered in UC Medications - No data to display  Initial Impression / Assessment and Plan / UC Course  I have reviewed the triage vital signs and the nursing notes.  Pertinent labs & imaging results that were available during my care of the patient were reviewed by me and considered in my medical decision making (see chart for details).     *** Final Clinical Impressions(s) / UC Diagnoses   Final diagnoses:  None   Discharge Instructions   None    ED Prescriptions   None    PDMP not reviewed this encounter.

## 2023-11-27 NOTE — Discharge Instructions (Signed)
Patient referred to ED for new lower head pain

## 2023-11-27 NOTE — ED Triage Notes (Signed)
Pt here for pain in back of her head for months; pt sts some issues with her vision x 1 week; pt seen multiple times recently for same

## 2023-11-27 NOTE — ED Notes (Signed)
 Patient is being discharged from the Urgent Care and sent to the Emergency Department via POV . Per [facility], patient is in need of higher level of care due to HA/vision changes. Patient is aware and verbalizes understanding of plan of care.  Vitals:   11/27/23 0915  BP: 126/86  Pulse: 85  Resp: 18  Temp: 98.3 F (36.8 C)  SpO2: 98%

## 2023-11-27 NOTE — Discharge Instructions (Signed)
 Return for any problem.  ?

## 2023-11-27 NOTE — ED Provider Notes (Signed)
 Patient seen after prior ED provider.  Patient is comfortable and without complaint.  She desires discharge home.  Workup and imaging obtained today is without evidence of acute pathology.  Patient does have already planned follow-up with headache specialist within the [facility] system.    Strict return precautions given and understood.  Importance of close follow-up stressed.   Laurice Maude BROCKS, MD 11/27/23 [phone]

## 2023-11-29 NOTE — ED Provider Notes (Signed)
 Pt followed by [facility] neurosurgery for management of Idiopathic intracranial HTN, with hx of stent placement here at [facility] with severe lower headache pain over the last few days. Left ED due to wait. She is here with mother reports that she feels weak and has generalized weakness. Provider seen in triage, discussed the limitations of work-up at [facility] and referred back to ED for appropriate work-up and management.  Mother will transport pt in personal vehicle.   Arloa Suzen RAMAN, NP 11/29/23 [phone]

## 2024-01-08 ENCOUNTER — Other Ambulatory Visit: Payer: Self-pay

## 2024-01-08 ENCOUNTER — Emergency Department (HOSPITAL_COMMUNITY)
Admission: EM | Admit: 2024-01-08 | Discharge: 2024-01-08 | Discharge status: Against Medical Advice | Attending: Emergency Medicine | Admitting: Emergency Medicine

## 2024-01-08 DIAGNOSIS — Z5321 Procedure and treatment not carried out due to patient leaving prior to being seen by health care provider: Secondary | ICD-10-CM | POA: Diagnosis not present

## 2024-01-08 DIAGNOSIS — H5710 Ocular pain, unspecified eye: Secondary | ICD-10-CM | POA: Diagnosis present

## 2024-01-08 NOTE — ED Triage Notes (Signed)
 Patient c/o eye pain x 1 year. Patient worsening eye pain tonight. Patient report taking gabapentin for her eye pain without relief. Patient denies N/V. Patient denies fever.

## 2024-03-03 ENCOUNTER — Ambulatory Visit: Admission: EM | Admit: 2024-03-03 | Discharge: 2024-03-03 | Disposition: A | Discharge status: Home / Self Care

## 2024-03-03 DIAGNOSIS — S61012A Laceration without foreign body of left thumb without damage to nail, initial encounter: Secondary | ICD-10-CM

## 2024-03-03 DIAGNOSIS — R519 Headache, unspecified: Secondary | ICD-10-CM | POA: Diagnosis not present

## 2024-03-03 DIAGNOSIS — S61213A Laceration without foreign body of left middle finger without damage to nail, initial encounter: Secondary | ICD-10-CM

## 2024-03-03 MED ORDER — TETANUS-DIPHTH-ACELL PERTUSSIS 5-2.5-18.5 LF-MCG/0.5 IM SUSY
0.5000 mL | PREFILLED_SYRINGE | Freq: Once | INTRAMUSCULAR | Status: AC
  Administered 2024-03-03: 0.5 mL via INTRAMUSCULAR

## 2024-03-03 MED ORDER — BUTALBITAL-APAP-CAFFEINE 50-325-40 MG PO TABS: 1.0000 | ORAL_TABLET | ORAL | 0 refills | Status: AC | PRN

## 2024-03-03 NOTE — Discharge Instructions (Addendum)
 You were seen today for a cut, also known as a laceration. A laceration is a type of injury that can go through all layers of the skin and sometimes into the tissue underneath. Some cuts heal on their own, but others need to be closed to promote proper healing and reduce the risk of infection. Your cut was treated using Steri-Strips, which are special adhesive strips used to help close the wound without stitches.  Keep the Steri-Strips dry and in place for the next 5 to 7 days. Do not remove them yourself. It is normal for them to start curling at the edges as they loosen over time. If they fall off on their own after several days, that's okay. Do not get the area wet for the first 24 hours. After that, you may shower, but avoid soaking or scrubbing the area. Gently pat the area dry with a clean towel and avoid applying any ointments, creams, or antiseptics.   Try not to bump or stretch the area while it's healing, and avoid activities that could reopen the cut. If needed, you can keep the area covered with a clean, dry bandage for protection.  You do not need to return for removal, but follow up with your healthcare provider if the area becomes red, swollen, warm, painful, or starts to drain pus. These may be signs of infection and should be evaluated promptly.  You were also seen today for a headache, which is pain or discomfort felt around the head or neck area. There are many different causes and types of headaches. Use the prescription medication only as needed for headaches. When you have a headache, try lying down in a dark, quiet room. Keep the lights dim if bright light bothers you or makes your headache worse. Eat meals regularly and stay hydrated by drinking enough fluids to keep your urine pale yellow. Try to reduce or stop your intake of caffeine, and limit alcohol. Avoid using any products that contain nicotine or tobacco. To help prevent future headaches, try relaxation techniques like massage  or deep breathing. Manage stress, sit with good posture, and avoid tensing your muscles. Make sure to get regular sleep--aim for 7 to 9 hours each night. Go to the emergency room right away if your headache becomes very severe very quickly, gets worse with physical activity, or if you experience repeated vomiting, neck pain or stiffness, changes in your vision, pain in an eye or ear, trouble speaking, weakness or loss of muscle control, loss of balance or coordination, fainting, confusion, or a seizure.

## 2024-03-03 NOTE — ED Triage Notes (Addendum)
 Pt reports she cut the tip of her left middle finger and the webbing between her thumb and left index finger yesterday when opening a can. States the fingertip bled for "15 hours". She is on Brilinta and aspirin. Last tetanus 2014

## 2024-03-03 NOTE — ED Provider Notes (Signed)
 [facility]    CSN: [id number] Arrival date & time: 03/03/24  4098      History   Chief Complaint Chief Complaint  Patient presents with   Laceration    HPI Heather Marsh is a 23 y.o. female.    Subjective:  Heather Marsh is a 23 y.o. female that presents with a laceration on her left middle finger and left hand that occurred yesterday around 12 PM. Patient reports that the injuries were due to the lid of a can in which she was trying to open. The patient delayed seeking medical attention until today. She denies any numbness or tingling in her fingers. Her last TDAP was in 2014. In addition to the laceration, Heather Marsh reports having a headache. She mentions being on a blood thinner, specifically Brilinta. The patient has a history of allergic reaction to Toradol , which was given to her in the ER previously. She typically uses tylenol  or Excedrin for her headaches. Heather Marsh denies experiencing dizziness, nausea, vomiting, visual disturbances with her current headache.  The following portions of the patient's history were reviewed and updated as appropriate: allergies, current medications, past family history, past medical history, past social history, past surgical history, and problem list.            Past Medical History:  Diagnosis Date   Anemia    Migraine    Sickle cell trait (HCC)     Patient Active Problem List   Diagnosis Date Noted   Goiter 12/21/2020   Slow transit constipation 12/21/2020   Iron deficiency 12/21/2020   Absence of bladder continence 12/21/2020   Severe obesity with body mass index (BMI) greater than 99th percentile for age in childhood (HCC) 08/07/2017   Pre-diabetes 08/07/2017   Anovulatory cycle 08/07/2017   Menorrhagia with irregular cycle 08/07/2017    Past Surgical History:  Procedure Laterality Date   ADENOIDECTOMY     STENT PLACEMENT VASCULAR ([facility] HX)      OB History     Gravida  0   Para  0   Term  0   Preterm   0   AB  0   Living  0      SAB  0   IAB  0   Ectopic  0   Multiple  0   Live Births  0            Home Medications    Prior to Admission medications   Medication Sig Start Date End Date Taking? Authorizing Provider  aspirin EC 81 MG tablet Take by mouth. 08/10/23  Yes [provider]  butalbital-acetaminophen -caffeine (FIORICET) 50-325-40 MG tablet Take 1 tablet by mouth every 4 (four) hours as needed for headache (do not to exceed 6 tablets within a 24 hour period). 03/03/24  Yes Maryruth Sol, FNP  etonogestrel (NEXPLANON) 68 MG IMPL implant 1 each by Subdermal route once.   Yes [provider]  gabapentin (NEURONTIN) 100 MG capsule Take 100 mg by mouth 3 (three) times daily.   Yes [provider]  potassium chloride  (KLOR-CON  M) 10 MEQ tablet Take by mouth. 03/05/23 03/04/24 Yes [provider]  ticagrelor (BRILINTA) 90 MG TABS tablet Take by mouth 2 (two) times daily.   Yes [provider]  carboxymethylcellulose (REFRESH PLUS) 0.5 % SOLN Apply to eye. 08/10/23   [provider]  esomeprazole (NEXIUM) 20 MG capsule Take 20 mg by mouth daily at 12 noon.    [provider]  Ferrous  Fumarate (HEMOCYTE - 106 MG FE) 324 (106 Fe) MG TABS tablet Take 1 tablet (106 mg of iron total) by mouth daily. 12/21/20 03/26/21  Acie Acosta, FNP  norethindrone-ethinyl estradiol  (JUNEL FE 1/20) 1-20 MG-MCG tablet Take 1 tablet by mouth daily. Patient not taking: Reported on 12/29/2019 12/09/19 01/09/20  Landa Pine, FNP  norgestrel -ethinyl estradiol  (LO/OVRAL ) 0.3-30 MG-MCG tablet Take 2 tablets by mouth daily. Until bleeding stops. Then take 1 tablet daily to finish pack. 12/29/19 01/09/20  Ovidio Blower, MD    Family History Family History  Problem Relation Age of Onset   Sickle cell trait Mother    Anxiety disorder Mother    Migraines Brother    Allergic rhinitis Brother    Migraines Maternal Grandmother     Hypertension Maternal Grandmother    Heart disease Maternal Grandmother    Asthma Paternal Grandmother    Menstrual problems Sister    Allergic rhinitis Sister    Hypertension Maternal Grandfather    Hyperlipidemia Maternal Grandfather    Sickle cell trait Maternal Grandfather    Menstrual problems Paternal Aunt    Fibroids Paternal Aunt     Social History Social History   Tobacco Use   Smoking status: Never    Passive exposure: Yes   Smokeless tobacco: Never   Tobacco comments:    smoking outside  Vaping Use   Vaping status: Never Used  Substance Use Topics   Alcohol use: No   Drug use: No     Allergies   Toradol  [ketorolac  tromethamine ]   Review of Systems Review of Systems  Eyes:  Negative for visual disturbance.  Skin:  Positive for wound.  Neurological:  Positive for numbness (and tingling at wound site) and headaches. Negative for dizziness, speech difficulty and weakness.  All other systems reviewed and are negative.    Physical Exam Triage Vital Signs ED Triage Vitals  Encounter Vitals Group     BP 03/03/24 0926 123/73     Systolic BP Percentile --      Diastolic BP Percentile --      Pulse Rate 03/03/24 0926 (!) 104     Resp 03/03/24 0926 16     Temp 03/03/24 0926 99.3 F (37.4 C)     Temp Source 03/03/24 0926 Oral     SpO2 03/03/24 0926 97 %     Weight --      Height --      Head Circumference --      Peak Flow --      Pain Score 03/03/24 0921 6     Pain Loc --      Pain Education --      Exclude from Growth Chart --    No data found.  Updated Vital Signs BP 123/73 (BP Location: Right Arm)   Pulse (!) 104   Temp 99.3 F (37.4 C) (Oral)   Resp 16   SpO2 97%   Visual Acuity Right Eye Distance:   Left Eye Distance:   Bilateral Distance:    Right Eye Near:   Left Eye Near:    Bilateral Near:     Physical Exam Vitals reviewed.  Constitutional:      General: She is awake. She is not in acute distress.    Appearance: Normal  appearance. She is well-developed. She is obese. She is not ill-appearing, toxic-appearing or diaphoretic.  HENT:     Head: Normocephalic.     Mouth/Throat:     Mouth: Mucous membranes  are moist.  Eyes:     General: Vision grossly intact. Gaze aligned appropriately.     Extraocular Movements: Extraocular movements intact.     Right eye: No nystagmus.     Left eye: No nystagmus.     Conjunctiva/sclera: Conjunctivae normal.  Cardiovascular:     Rate and Rhythm: Normal rate and regular rhythm.     Heart sounds: Normal heart sounds.  Pulmonary:     Effort: Pulmonary effort is normal.     Breath sounds: Normal breath sounds and air entry.  Musculoskeletal:        General: Normal range of motion.     Left hand: Laceration and tenderness present. No bony tenderness. Normal range of motion. Normal strength. Normal sensation. Normal capillary refill.     Cervical back: Full passive range of motion without pain, normal range of motion and neck supple.     Comments: See below   Skin:    General: Skin is warm and dry.     Findings: Laceration and wound present.     Comments: Lacerations to the distal aspect of the left middle finger as well as the thenar space of the left hand. No active bleeding, swelling, drainage, or surrounding erythema noted (see below)  Neurological:     General: No focal deficit present.     Mental Status: She is alert and oriented to person, place, and time.     Cranial Nerves: Cranial nerves 2-12 are intact. No cranial nerve deficit.     Sensory: Sensation is intact. No sensory deficit.     Motor: Motor function is intact. No weakness.     Coordination: Coordination is intact.     Gait: Gait is intact.  Psychiatric:        Mood and Affect: Mood normal.        Behavior: Behavior normal. Behavior is cooperative.     Laceration to the thenar space of the left hand   Laceration to the distal aspect of the left middle finger   UC Treatments / Results  Labs (all  labs ordered are listed, but only abnormal results are displayed) Labs Reviewed - No data to display  EKG   Radiology No results found.  Procedures Wound Care  Date/Time: 03/03/2024 9:56 AM  Performed by: Maryruth Sol, FNP Authorized by: Maryruth Sol, FNP   Consent:    Consent obtained:  Verbal   Consent given by:  Patient   Risks, benefits, and alternatives were discussed: yes     Risks discussed:  Infection, pain and poor cosmetic result Universal protocol:    Patient identity confirmed:  Verbally with patient and arm band Sedation:    Sedation type:  None Anesthesia:    Anesthesia method:  None Procedure details:    Indications: open wounds     Wound location:  Finger   Finger location:  L long finger Skin layer closed with:    Wound care performed:  Steri-strips placed Dressing:    Dressing applied:  Telfa pad   Wrapped with:  Coban 2 inch Post-procedure details:    Procedure completion:  Tolerated well, no immediate complications Wound Care  Date/Time: 03/03/2024 9:57 AM  Performed by: Maryruth Sol, FNP Authorized by: Maryruth Sol, FNP   Consent:    Consent obtained:  Verbal   Consent given by:  Patient   Risks, benefits, and alternatives were discussed: yes     Risks discussed:  Infection, pain and poor cosmetic result Universal protocol:  Patient identity confirmed:  Verbally with patient and arm band Sedation:    Sedation type:  None Anesthesia:    Anesthesia method:  None Procedure details:    Indications: open wounds     Wound location:  Hand   Hand location: thenar aspect of the left hand. Skin layer closed with:    Wound care performed:  Steri-strips placed Dressing:    Dressing applied:  Telfa pad   Wrapped with:  Coban 2 inch Post-procedure details:    Procedure completion:  Tolerated well, no immediate complications  (including critical care time)  Medications Ordered in UC Medications  Tdap (BOOSTRIX) injection  0.5 mL (0.5 mLs Intramuscular Given 03/03/24 1001)    Initial Impression / Assessment and Plan / UC Course  I have reviewed the triage vital signs and the nursing notes.  Pertinent labs & imaging results that were available during my care of the patient were reviewed by me and considered in my medical decision making (see chart for details).   Patient presents with a laceration to the fingers sustained approximately 24 hours ago. No numbness or tingling reported. Last documented tetanus vaccination was in 2014. Wound edges were approximated using Steri-Strips and a dressing was applied, with instructions to keep the dressing on for 24 hours and the Steri-Strips in place for up to one week. Tetanus vaccine administered in clinic. Patient was instructed to avoid submerging the hand in water, to remove the dressing after 10 AM tomorrow, and to monitor for signs of infection such as redness, swelling, or drainage. Advised the area may get wet in the shower but should be gently patted dry and apply a thin layer of Neosporin over the Steri-Strips after cleansing and drying. Additionally, patient reports a headache and is currently on Brilinta with a known allergy to Toradol . No focal deficits noted on exam. Fioricet prescribed for headache relief, and patient was educated on its components and advised to use it in place of Excedrin Migraine or Tylenol .  Today's evaluation has revealed no signs of a dangerous process. Discussed diagnosis with patient and/or guardian. Patient and/or guardian aware of their diagnosis, possible red flag symptoms to watch out for and need for close follow up. Patient and/or guardian understands verbal and written discharge instructions. Patient and/or guardian comfortable with plan and disposition.  Patient and/or guardian has a clear mental status at this time, good insight into illness (after discussion and teaching) and has clear judgment to make decisions regarding their  care  Documentation was completed with the aid of voice recognition software. Transcription may contain typographical errors. Final Clinical Impressions(s) / UC Diagnoses   Final diagnoses:  Laceration of left middle finger without foreign body without damage to nail, initial encounter  Laceration of left thumb without foreign body without damage to nail, initial encounter  Acute intractable headache, unspecified headache type     Discharge Instructions      You were seen today for a cut, also known as a laceration. A laceration is a type of injury that can go through all layers of the skin and sometimes into the tissue underneath. Some cuts heal on their own, but others need to be closed to promote proper healing and reduce the risk of infection. Your cut was treated using Steri-Strips, which are special adhesive strips used to help close the wound without stitches.  Keep the Steri-Strips dry and in place for the next 5 to 7 days. Do not remove them yourself. It is normal for  them to start curling at the edges as they loosen over time. If they fall off on their own after several days, that's okay. Do not get the area wet for the first 24 hours. After that, you may shower, but avoid soaking or scrubbing the area. Gently pat the area dry with a clean towel and avoid applying any ointments, creams, or antiseptics.   Try not to bump or stretch the area while it's healing, and avoid activities that could reopen the cut. If needed, you can keep the area covered with a clean, dry bandage for protection.  You do not need to return for removal, but follow up with your healthcare provider if the area becomes red, swollen, warm, painful, or starts to drain pus. These may be signs of infection and should be evaluated promptly.  You were also seen today for a headache, which is pain or discomfort felt around the head or neck area. There are many different causes and types of headaches. Use the  prescription medication only as needed for headaches. When you have a headache, try lying down in a dark, quiet room. Keep the lights dim if bright light bothers you or makes your headache worse. Eat meals regularly and stay hydrated by drinking enough fluids to keep your urine pale yellow. Try to reduce or stop your intake of caffeine, and limit alcohol. Avoid using any products that contain nicotine or tobacco. To help prevent future headaches, try relaxation techniques like massage or deep breathing. Manage stress, sit with good posture, and avoid tensing your muscles. Make sure to get regular sleep--aim for 7 to 9 hours each night. Go to the emergency room right away if your headache becomes very severe very quickly, gets worse with physical activity, or if you experience repeated vomiting, neck pain or stiffness, changes in your vision, pain in an eye or ear, trouble speaking, weakness or loss of muscle control, loss of balance or coordination, fainting, confusion, or a seizure.    ED Prescriptions     Medication Sig Dispense Auth. Provider   butalbital-acetaminophen -caffeine (FIORICET) 50-325-40 MG tablet Take 1 tablet by mouth every 4 (four) hours as needed for headache (do not to exceed 6 tablets within a 24 hour period). 20 tablet Maryruth Sol, FNP      PDMP not reviewed this encounter.   Maryruth Sol, [place] 03/03/24 1012

## 2024-05-07 ENCOUNTER — Other Ambulatory Visit: Payer: Self-pay

## 2024-05-07 ENCOUNTER — Emergency Department (HOSPITAL_COMMUNITY)
Admission: EM | Admit: 2024-05-07 | Discharge: 2024-05-07 | Discharge status: Against Medical Advice | Attending: Emergency Medicine | Admitting: Emergency Medicine

## 2024-05-07 DIAGNOSIS — R519 Headache, unspecified: Secondary | ICD-10-CM | POA: Diagnosis present

## 2024-05-07 DIAGNOSIS — H9201 Otalgia, right ear: Secondary | ICD-10-CM | POA: Insufficient documentation

## 2024-05-07 DIAGNOSIS — Z5321 Procedure and treatment not carried out due to patient leaving prior to being seen by health care provider: Secondary | ICD-10-CM | POA: Diagnosis not present

## 2024-05-07 LAB — CBC WITH DIFFERENTIAL/PLATELET
Abs Immature Granulocytes: 0.04 K/uL (ref 0.00–0.07)
Basophils Absolute: 0.1 K/uL (ref 0.0–0.1)
Basophils Relative: 1 %
Eosinophils Absolute: 0.6 K/uL — ABNORMAL HIGH (ref 0.0–0.5)
Eosinophils Relative: 5 %
HCT: 43.5 % (ref 36.0–46.0)
Hemoglobin: 13.9 g/dL (ref 12.0–15.0)
Immature Granulocytes: 0 %
Lymphocytes Relative: 29 %
Lymphs Abs: 3 K/uL (ref 0.7–4.0)
MCH: 28.5 pg (ref 26.0–34.0)
MCHC: 32 g/dL (ref 30.0–36.0)
MCV: 89.1 fL (ref 80.0–100.0)
Monocytes Absolute: 0.8 K/uL (ref 0.1–1.0)
Monocytes Relative: 7 %
Neutro Abs: 6 K/uL (ref 1.7–7.7)
Neutrophils Relative %: 58 %
Platelets: 318 K/uL (ref 150–400)
RBC: 4.88 MIL/uL (ref 3.87–5.11)
RDW: 13.6 % (ref 11.5–15.5)
WBC: 10.4 K/uL (ref 4.0–10.5)
nRBC: 0 % (ref 0.0–0.2)

## 2024-05-07 LAB — BASIC METABOLIC PANEL WITH GFR
Anion gap: 10 (ref 5–15)
BUN: 16 mg/dL (ref 6–20)
CO2: 23 mmol/L (ref 22–32)
Calcium: 9.4 mg/dL (ref 8.9–10.3)
Chloride: 103 mmol/L (ref 98–111)
Creatinine, Ser: 0.85 mg/dL (ref 0.44–1.00)
GFR, Estimated: >60 mL/min (ref >60)
Glucose, Bld: 133 mg/dL — ABNORMAL HIGH (ref 70–99)
Potassium: 3.8 mmol/L (ref 3.5–5.1)
Sodium: 136 mmol/L (ref 135–145)

## 2024-05-07 NOTE — ED Notes (Signed)
 Patient left AMA.

## 2024-05-07 NOTE — ED Triage Notes (Signed)
 Pt/mother  stated, she has a stent in her head and has had pain on her right ear and behind her ear. Called her Dr. At [facility] and said to come here for a MRI . And let him know. Ive also have right side ear pain. Also under each breast she has big bumps under her breast.  She has also pressure in her nose.  I want the Dr. To look at all this since we are here.

## 2024-05-14 ENCOUNTER — Emergency Department (HOSPITAL_COMMUNITY)
Admission: EM | Admit: 2024-05-14 | Discharge: 2024-05-14 | Disposition: A | Discharge status: Home / Self Care | Attending: Emergency Medicine | Admitting: Emergency Medicine

## 2024-05-14 ENCOUNTER — Other Ambulatory Visit: Payer: Self-pay

## 2024-05-14 ENCOUNTER — Emergency Department (HOSPITAL_COMMUNITY)

## 2024-05-14 ENCOUNTER — Encounter (HOSPITAL_COMMUNITY): Payer: Self-pay

## 2024-05-14 DIAGNOSIS — R519 Headache, unspecified: Secondary | ICD-10-CM | POA: Diagnosis present

## 2024-05-14 DIAGNOSIS — G932 Benign intracranial hypertension: Secondary | ICD-10-CM | POA: Insufficient documentation

## 2024-05-14 DIAGNOSIS — Z7982 Long term (current) use of aspirin: Secondary | ICD-10-CM | POA: Insufficient documentation

## 2024-05-14 LAB — BASIC METABOLIC PANEL WITH GFR
Anion gap: 10 (ref 5–15)
BUN: 12 mg/dL (ref 6–20)
CO2: 25 mmol/L (ref 22–32)
Calcium: 9.5 mg/dL (ref 8.9–10.3)
Chloride: 103 mmol/L (ref 98–111)
Creatinine, Ser: 0.78 mg/dL (ref 0.44–1.00)
GFR, Estimated: >60 mL/min (ref >60)
Glucose, Bld: 174 mg/dL — ABNORMAL HIGH (ref 70–99)
Potassium: 4.4 mmol/L (ref 3.5–5.1)
Sodium: 138 mmol/L (ref 135–145)

## 2024-05-14 LAB — CBC WITH DIFFERENTIAL/PLATELET
Abs Immature Granulocytes: 0.04 K/uL (ref 0.00–0.07)
Basophils Absolute: 0.1 K/uL (ref 0.0–0.1)
Basophils Relative: 1 %
Eosinophils Absolute: 0.5 K/uL (ref 0.0–0.5)
Eosinophils Relative: 5 %
HCT: 48.2 % — ABNORMAL HIGH (ref 36.0–46.0)
Hemoglobin: 15 g/dL (ref 12.0–15.0)
Immature Granulocytes: 0 %
Lymphocytes Relative: 33 %
Lymphs Abs: 3 K/uL (ref 0.7–4.0)
MCH: 28.2 pg (ref 26.0–34.0)
MCHC: 31.1 g/dL (ref 30.0–36.0)
MCV: 90.8 fL (ref 80.0–100.0)
Monocytes Absolute: 0.5 K/uL (ref 0.1–1.0)
Monocytes Relative: 5 %
Neutro Abs: 5 K/uL (ref 1.7–7.7)
Neutrophils Relative %: 56 %
Platelets: 389 K/uL (ref 150–400)
RBC: 5.31 MIL/uL — ABNORMAL HIGH (ref 3.87–5.11)
RDW: 13.5 % (ref 11.5–15.5)
WBC: 9.1 K/uL (ref 4.0–10.5)
nRBC: 0 % (ref 0.0–0.2)

## 2024-05-14 LAB — HCG, SERUM, QUALITATIVE: Preg, Serum: NEGATIVE

## 2024-05-14 MED ORDER — DEXAMETHASONE SODIUM PHOSPHATE 10 MG/ML IJ SOLN
10.0000 mg | Freq: Once | INTRAMUSCULAR | Status: AC
  Administered 2024-05-14: 10 mg via INTRAVENOUS
  Filled 2024-05-14: qty 1

## 2024-05-14 MED ORDER — HYDROMORPHONE HCL 1 MG/ML IJ SOLN
1.0000 mg | Freq: Once | INTRAMUSCULAR | Status: AC
  Administered 2024-05-14: 1 mg via INTRAVENOUS
  Filled 2024-05-14: qty 1

## 2024-05-14 MED ORDER — DIPHENHYDRAMINE HCL 50 MG/ML IJ SOLN
25.0000 mg | Freq: Once | INTRAMUSCULAR | Status: AC
  Administered 2024-05-14: 25 mg via INTRAVENOUS
  Filled 2024-05-14: qty 1

## 2024-05-14 MED ORDER — METOCLOPRAMIDE HCL 5 MG/ML IJ SOLN
10.0000 mg | Freq: Once | INTRAMUSCULAR | Status: AC
  Administered 2024-05-14: 10 mg via INTRAVENOUS
  Filled 2024-05-14: qty 2

## 2024-05-14 MED ORDER — SODIUM CHLORIDE 0.9 % IV BOLUS
1000.0000 mL | Freq: Once | INTRAVENOUS | Status: AC
  Administered 2024-05-14: 1000 mL via INTRAVENOUS

## 2024-05-14 NOTE — Discharge Instructions (Signed)
 Workup with MRA MRI without any acute findings.  Follow back up with [facility] neurology.  Return for any new or worse symptoms.

## 2024-05-14 NOTE — ED Notes (Addendum)
 Heather Marsh

## 2024-05-14 NOTE — ED Triage Notes (Signed)
 Pt reports with right ear pain and reports having a stent placed a yr ago. Her provider wants to make sure that she does not have a clot on her stent.

## 2024-05-14 NOTE — ED Provider Notes (Addendum)
 [place] EMERGENCY DEPARTMENT AT [facility] Provider Note   CSN: [id number] Arrival date & time: 05/14/24  9084     Patient presents with: Otalgia   Heather Marsh is a 23 y.o. female.   Patient with a known history of  IIH, status post stents x 2 most recent one was done in October 2024.  Original one was done in February 2024.  Patient is followed by [facility] neurology.  Patient states that she has had about a week history of some increased pain around the right ear that kind of radiates up into the forehead area.  No significant worsening with the vision.  Patient is not on Diamox  anymore.  And patient was recently taken off of her blood thinner with think that was freelance.  [facility] referred her in for consideration for imaging.  Patient tried to get seen July 16 over at [facility] but waited 3 hours and then left.  No nausea no vomiting.  No fevers.  Patient was last seen by us  in the emergency department for this on February 2025 had MRI with and without without any acute findings.  She was treated with a migraine cocktail which improved her headache.  Past medical history significant for sickle trait migraine anemia does not mention that she has the IIH.       Prior to Admission medications   Medication Sig Start Date End Date Taking? Authorizing Provider  aspirin EC 81 MG tablet Take by mouth. 08/10/23   [provider]  butalbital -acetaminophen -caffeine  (FIORICET) 50-325-40 MG tablet Take 1 tablet by mouth every 4 (four) hours as needed for headache (do not to exceed 6 tablets within a 24 hour period). 03/03/24   Iola Lukes, FNP  carboxymethylcellulose (REFRESH PLUS) 0.5 % SOLN Apply to eye. 08/10/23   [provider]  esomeprazole (NEXIUM) 20 MG capsule Take 20 mg by mouth daily at 12 noon.    [provider]  etonogestrel (NEXPLANON) 68 MG IMPL implant 1 each by Subdermal route once.    [provider]  gabapentin (NEURONTIN) 100 MG  capsule Take 100 mg by mouth 3 (three) times daily.    [provider]  ticagrelor (BRILINTA) 90 MG TABS tablet Take by mouth 2 (two) times daily.    [provider]  Ferrous Fumarate  (HEMOCYTE - 106 MG FE) 324 (106 Fe) MG TABS tablet Take 1 tablet (106 mg of iron total) by mouth daily. 12/21/20 03/26/21  Viviana Aleck DASEN, FNP  norethindrone-ethinyl estradiol  (JUNEL FE 1/20) 1-20 MG-MCG tablet Take 1 tablet by mouth daily. Patient not taking: Reported on 12/29/2019 12/09/19 01/09/20  Adah Wilbert LABOR, FNP  norgestrel -ethinyl estradiol  (LO/OVRAL ) 0.3-30 MG-MCG tablet Take 2 tablets by mouth daily. Until bleeding stops. Then take 1 tablet daily to finish pack. 12/29/19 01/09/20  Dorrene Nest, MD    Allergies: Toradol  [ketorolac  tromethamine ]    Review of Systems  Constitutional:  Negative for chills and fever.  HENT:  Positive for ear pain. Negative for sore throat.   Eyes:  Negative for pain and visual disturbance.  Respiratory:  Negative for cough and shortness of breath.   Cardiovascular:  Negative for chest pain and palpitations.  Gastrointestinal:  Negative for abdominal pain and vomiting.  Genitourinary:  Negative for dysuria and hematuria.  Musculoskeletal:  Negative for arthralgias and back pain.  Skin:  Negative for color change and rash.  Neurological:  Positive for headaches. Negative for seizures and syncope.  All other systems reviewed and are negative.  Updated Vital Signs BP (!) 141/102 (BP Location: Left Arm)   Pulse (!) 104   Temp 99.9 F (37.7 C)   Resp 18   SpO2 99%   Physical Exam Vitals and nursing note reviewed.  Constitutional:      General: She is not in acute distress.    Appearance: Normal appearance. She is well-developed.  HENT:     Head: Normocephalic and atraumatic.     Right Ear: Tympanic membrane and ear canal normal.     Left Ear: Tympanic membrane and ear canal normal.  Eyes:     Extraocular Movements: Extraocular movements intact.      Conjunctiva/sclera: Conjunctivae normal.     Pupils: Pupils are equal, round, and reactive to light.  Cardiovascular:     Rate and Rhythm: Normal rate and regular rhythm.     Heart sounds: No murmur heard. Pulmonary:     Effort: Pulmonary effort is normal. No respiratory distress.     Breath sounds: Normal breath sounds.  Abdominal:     Palpations: Abdomen is soft.     Tenderness: There is no abdominal tenderness.  Musculoskeletal:        General: No swelling.     Cervical back: Neck supple.  Skin:    General: Skin is warm and dry.     Capillary Refill: Capillary refill takes less than 2 seconds.  Neurological:     General: No focal deficit present.     Mental Status: She is alert and oriented to person, place, and time.     Cranial Nerves: No cranial nerve deficit.     Sensory: No sensory deficit.     Motor: No weakness.  Psychiatric:        Mood and Affect: Mood normal.     (all labs ordered are listed, but only abnormal results are displayed) Labs Reviewed  CBC WITH DIFFERENTIAL/PLATELET - Abnormal; Notable for the following components:      Result Value   RBC 5.31 (*)    HCT 48.2 (*)    All other components within normal limits  BASIC METABOLIC PANEL WITH GFR - Abnormal; Notable for the following components:   Glucose, Bld 174 (*)    All other components within normal limits  HCG, SERUM, QUALITATIVE    EKG: None  Radiology: No results found.   Procedures   Medications Ordered in the ED  sodium chloride  0.9 % bolus 1,000 mL (1,000 mLs Intravenous New Bag/Given 05/14/24 1051)  HYDROmorphone  (DILAUDID ) injection 1 mg (1 mg Intravenous Given 05/14/24 1050)  dexamethasone  (DECADRON ) injection 10 mg (10 mg Intravenous Given 05/14/24 1049)  metoCLOPramide  (REGLAN ) injection 10 mg (10 mg Intravenous Given 05/14/24 1050)  diphenhydrAMINE  (BENADRYL ) injection 25 mg (25 mg Intravenous Given 05/14/24 1050)                                    Medical Decision  Making Amount and/or Complexity of Data Reviewed Labs: ordered. Radiology: ordered.  Risk Prescription drug management.   hCG is negative.  CBC white count 9.1 hemoglobin 15 platelets are normal at 389.  Basic metabolic panel normal renal function normal.  Will give patient migraine cocktail.  Will get MRI with and without to evaluate the shunt and her history of IIH.  Discussed with Dr. Lindzen from neurology.  They are recommending MRI brain without and then MRA with and without.  Patient's renal function is normal.  Have ordered those in MRs  MRA MRI without any acute findings.  MRI basically unchanged from that in February 2025.  MRAs and the stents are visualized.  No apparent acute abnormalities.  As per radiology.  Patient stable for discharge home.   Final diagnoses:  IIH (idiopathic intracranial hypertension)  Acute intractable headache, unspecified headache type    ED Discharge Orders     None          Geraldene Hamilton, MD 05/14/24 1148    Geraldene Hamilton, MD 05/14/24 1226    Geraldene Hamilton, MD 05/14/24 [id number]

## 2024-07-20 ENCOUNTER — Ambulatory Visit: Admission: EM | Admit: 2024-07-20 | Discharge: 2024-07-20 | Disposition: A | Discharge status: Home / Self Care

## 2024-07-20 DIAGNOSIS — F32A Depression, unspecified: Secondary | ICD-10-CM | POA: Diagnosis not present

## 2024-07-20 DIAGNOSIS — F419 Anxiety disorder, unspecified: Secondary | ICD-10-CM | POA: Diagnosis not present

## 2024-07-20 MED ORDER — HYDROXYZINE HCL 25 MG PO TABS: 25.0000 mg | ORAL_TABLET | Freq: Three times a day (TID) | ORAL | 2 refills | Status: AC | PRN

## 2024-07-20 NOTE — ED Triage Notes (Signed)
 Pt present panic attack, symptom started yesterday. Pt states everything feels like its going slow and she wants cry.

## 2024-07-20 NOTE — Discharge Instructions (Signed)
  1. Anxiety and depression (Primary) - hydrOXYzine  (ATARAX ) 25 MG tablet; Take 1 tablet (25 mg total) by mouth 3 (three) times daily as needed for anxiety.  Dispense: 30 tablet; Refill: 2 - Do not take medication if you have to drive or work as it may cause you to feel drowsy. - If symptoms do not improve with current medication follow-up with [facility] behavioral health urgent care for further evaluation and treatment. - If symptoms become progressively worse follow-up in emergency department for immediate evaluation and treatment.

## 2024-07-20 NOTE — ED Provider Notes (Signed)
 [facility]  Note:  This document was prepared using Conservation officer, historic buildings and may include unintentional dictation errors.  MRN: [id number] DOB: [birth year]  Subjective:   Heather Marsh is a 23 y.o. female presenting for increased anxiety and panic attack since yesterday.  Patient reports past history of anxiety symptoms after the passing of her grandmother.  Patient has not taken any previously prescribed over-the-counter medication to treat symptoms.  Patient states that she feels like everything is going slow and she wants to cry .  Patient denies past evaluation or treatment for anxiety.  No current facility-administered medications for this encounter.  Current Outpatient Medications:    hydrOXYzine  (ATARAX ) 25 MG tablet, Take 1 tablet (25 mg total) by mouth 3 (three) times daily as needed for anxiety., Disp: 30 tablet, Rfl: 2   aspirin EC 81 MG tablet, Take by mouth., Disp: , Rfl:    butalbital -acetaminophen -caffeine  (FIORICET) 50-325-40 MG tablet, Take 1 tablet by mouth every 4 (four) hours as needed for headache (do not to exceed 6 tablets within a 24 hour period)., Disp: 20 tablet, Rfl: 0   carboxymethylcellulose (REFRESH PLUS) 0.5 % SOLN, Apply to eye., Disp: , Rfl:    esomeprazole (NEXIUM) 20 MG capsule, Take 20 mg by mouth daily at 12 noon., Disp: , Rfl:    etonogestrel (NEXPLANON) 68 MG IMPL implant, 1 each by Subdermal route once., Disp: , Rfl:    gabapentin (NEURONTIN) 100 MG capsule, Take 100 mg by mouth 3 (three) times daily., Disp: , Rfl:    ticagrelor (BRILINTA) 90 MG TABS tablet, Take by mouth 2 (two) times daily., Disp: , Rfl:    Allergies  Allergen Reactions   Toradol  [Ketorolac  Tromethamine ] Nausea Only and Rash    Past Medical History:  Diagnosis Date   Anemia    Migraine    Sickle cell trait      Past Surgical History:  Procedure Laterality Date   ADENOIDECTOMY     STENT PLACEMENT VASCULAR ([facility] HX)      Family History  Problem  Relation Age of Onset   Sickle cell trait Mother    Anxiety disorder Mother    Migraines Brother    Allergic rhinitis Brother    Migraines Maternal Grandmother    Hypertension Maternal Grandmother    Heart disease Maternal Grandmother    Asthma Paternal Grandmother    Menstrual problems Sister    Allergic rhinitis Sister    Hypertension Maternal Grandfather    Hyperlipidemia Maternal Grandfather    Sickle cell trait Maternal Grandfather    Menstrual problems Paternal Aunt    Fibroids Paternal Aunt     Social History   Tobacco Use   Smoking status: Never    Passive exposure: Yes   Smokeless tobacco: Never   Tobacco comments:    smoking outside  Vaping Use   Vaping status: Never Used  Substance Use Topics   Alcohol use: No   Drug use: No    ROS Refer to HPI for ROS details.  Objective:   Vitals: BP 115/81 (BP Location: Left Arm)   Pulse (!) 119   Temp 99.7 F (37.6 C) (Oral)   Resp 18   SpO2 95%   Physical Exam Vitals and nursing note reviewed.  Constitutional:      General: She is not in acute distress.    Appearance: Normal appearance. She is well-developed. She is not ill-appearing or toxic-appearing.  HENT:     Head: Normocephalic and atraumatic.  Cardiovascular:  Rate and Rhythm: Normal rate.  Pulmonary:     Effort: Pulmonary effort is normal. No respiratory distress.  Skin:    General: Skin is warm and dry.  Neurological:     General: No focal deficit present.     Mental Status: She is alert and oriented to person, place, and time.  Psychiatric:        Mood and Affect: Affect is flat.        Behavior: Behavior is withdrawn. Behavior is cooperative.        Thought Content: Thought content does not include homicidal or suicidal ideation.     Procedures  No results found for this or any previous visit (from the past 24 hours).  No results found.   Assessment and Plan :     Discharge Instructions       1. Anxiety and depression  (Primary) - hydrOXYzine  (ATARAX ) 25 MG tablet; Take 1 tablet (25 mg total) by mouth 3 (three) times daily as needed for anxiety.  Dispense: 30 tablet; Refill: 2 - Do not take medication if you have to drive or work as it may cause you to feel drowsy. - If symptoms do not improve with current medication follow-up with [facility] behavioral health urgent care for further evaluation and treatment. - If symptoms become progressively worse follow-up in emergency department for immediate evaluation and treatment.       Chinonso Linker B Rilee Wendling   Alli Jasmer, [place], [place] 07/20/24 [phone]

## 2024-07-23 DIAGNOSIS — R000 Tachycardia, unspecified: Secondary | ICD-10-CM | POA: Insufficient documentation

## 2024-07-23 DIAGNOSIS — J3489 Other specified disorders of nose and nasal sinuses: Secondary | ICD-10-CM | POA: Insufficient documentation

## 2024-07-23 DIAGNOSIS — H53453 Other localized visual field defect, bilateral: Secondary | ICD-10-CM | POA: Insufficient documentation

## 2024-07-28 DIAGNOSIS — N946 Dysmenorrhea, unspecified: Secondary | ICD-10-CM | POA: Insufficient documentation

## 2024-07-28 DIAGNOSIS — G4700 Insomnia, unspecified: Secondary | ICD-10-CM | POA: Insufficient documentation

## 2024-07-28 DIAGNOSIS — R0981 Nasal congestion: Secondary | ICD-10-CM | POA: Insufficient documentation

## 2024-07-28 DIAGNOSIS — R079 Chest pain, unspecified: Secondary | ICD-10-CM | POA: Insufficient documentation

## 2024-07-28 DIAGNOSIS — R5382 Chronic fatigue, unspecified: Secondary | ICD-10-CM | POA: Insufficient documentation

## 2024-07-28 DIAGNOSIS — E010 Iodine-deficiency related diffuse (endemic) goiter: Secondary | ICD-10-CM | POA: Insufficient documentation

## 2024-09-26 ENCOUNTER — Ambulatory Visit (INDEPENDENT_AMBULATORY_CARE_PROVIDER_SITE_OTHER)

## 2024-09-26 ENCOUNTER — Ambulatory Visit: Admission: EM | Admit: 2024-09-26 | Discharge: 2024-09-26 | Disposition: A | Discharge status: Home / Self Care

## 2024-09-26 DIAGNOSIS — J3489 Other specified disorders of nose and nasal sinuses: Secondary | ICD-10-CM | POA: Diagnosis not present

## 2024-09-26 DIAGNOSIS — J0120 Acute ethmoidal sinusitis, unspecified: Secondary | ICD-10-CM

## 2024-09-26 MED ORDER — AMOXICILLIN-POT CLAVULANATE 875-125 MG PO TABS: 1.0000 | ORAL_TABLET | Freq: Two times a day (BID) | ORAL | 0 refills | Status: AC

## 2024-09-26 NOTE — ED Provider Notes (Signed)
 [facility]    CSN: [id number] Arrival date & time: 09/26/24  0806      History   Chief Complaint Chief Complaint  Patient presents with   Nose pain    HPI Heather Marsh is a 23 y.o. female.   Patient presents today with a several day history of right nasal pain.  She reports that the pain is rated 10 on a 0-10 pain scale, described as pressure, no alleviating factors identified.  She denies any known injury and discomfort she did not injure herself.  Denies any recent falls.  She did have some increased congestion that did have some blood-tinged mucus but denies any recent illness or additional symptoms including cough, fever, epistaxis, nausea, vomiting.  She does use Flonase  with this has been ineffective.  She has also tried Tylenol  and ibuprofen  without improvement.  She does have several nose piercings but does not currently have them in denies any recent piercings or trauma.  She does have a septal piercing but reports that she is not experiencing any discomfort or pain in this region.  She is confident that she is not pregnant as she has Nexplanon.    Past Medical History:  Diagnosis Date   Anemia    Migraine    Sickle cell trait     Patient Active Problem List   Diagnosis Date Noted   Insomnia 07/28/2024   Dysmenorrhea 07/28/2024   Chronic fatigue 07/28/2024   Chest pain at rest 07/28/2024   Nasal congestion 07/28/2024   Thyromegaly 07/28/2024   Loss of peripheral visual field, bilateral 07/23/2024   Tachycardia 07/23/2024   Nose pain 07/23/2024   Iron deficiency anemia due to chronic blood loss 02/27/2023   Idiopathic intracranial hypertension 12/20/2022   Morbid (severe) obesity due to excess calories (HCC) 12/19/2022   Acid reflux 06/02/2022   Goiter 12/21/2020   Slow transit constipation 12/21/2020   Iron deficiency 12/21/2020   Absence of bladder continence 12/21/2020   Severe obesity with body mass index (BMI) greater than 99th percentile for  age in childhood (HCC) 08/07/2017   Pre-diabetes 08/07/2017   Anovulatory cycle 08/07/2017   Menorrhagia with irregular cycle 08/07/2017    Past Surgical History:  Procedure Laterality Date   ADENOIDECTOMY     STENT PLACEMENT VASCULAR ([facility] HX)      OB History     Gravida  0   Para  0   Term  0   Preterm  0   AB  0   Living  0      SAB  0   IAB  0   Ectopic  0   Multiple  0   Live Births  0            Home Medications    Prior to Admission medications   Medication Sig Start Date End Date Taking? Authorizing Provider  amitriptyline (ELAVIL) 10 MG tablet Take 10 mg by mouth at bedtime. 09/08/24  Yes [provider]  amoxicillin -clavulanate (AUGMENTIN ) 875-125 MG tablet Take 1 tablet by mouth every 12 (twelve) hours. 09/26/24  Yes Janelle Culton K, PA-C  aspirin EC 81 MG tablet Take by mouth. 08/10/23  Yes [provider]  etonogestrel (NEXPLANON) 68 MG IMPL implant 1 each by Subdermal route once.   Yes [provider]  fluticasone  (FLONASE ) 50 MCG/ACT nasal spray Place 2 sprays into the nose daily. 07/23/24  Yes [provider]  propranolol (INDERAL) 20 MG tablet Take 20 mg by mouth  as directed. 06/06/24  Yes [provider]  rizatriptan (MAXALT) 10 MG tablet Take 10 mg by mouth as needed. 06/06/24  Yes [provider]  SUMAtriptan (IMITREX) 50 MG tablet Take 50 mg by mouth every 2 (two) hours as needed. 09/08/24 09/08/25 Yes [provider]  butalbital -acetaminophen -caffeine  (FIORICET) 50-325-40 MG tablet Take 1 tablet by mouth every 4 (four) hours as needed for headache (do not to exceed 6 tablets within a 24 hour period). 03/03/24   Iola Lukes, FNP  carboxymethylcellulose (REFRESH PLUS) 0.5 % SOLN Apply to eye. 08/10/23   [provider]  esomeprazole (NEXIUM) 20 MG capsule Take 20 mg by mouth daily at 12 noon.    [provider]  gabapentin (NEURONTIN) 100 MG capsule Take 100  mg by mouth 3 (three) times daily.    [provider]  hydrOXYzine  (ATARAX ) 25 MG tablet Take 1 tablet (25 mg total) by mouth 3 (three) times daily as needed for anxiety. 07/20/24   Reddick, Johnathan B, NP  ticagrelor (BRILINTA) 90 MG TABS tablet Take by mouth 2 (two) times daily.    [provider]  Ferrous Fumarate  (HEMOCYTE - 106 MG FE) 324 (106 Fe) MG TABS tablet Take 1 tablet (106 mg of iron total) by mouth daily. 12/21/20 03/26/21  Viviana Aleck DASEN, FNP  norethindrone-ethinyl estradiol  (JUNEL FE 1/20) 1-20 MG-MCG tablet Take 1 tablet by mouth daily. Patient not taking: Reported on 12/29/2019 12/09/19 01/09/20  Adah Corning A, FNP  norgestrel -ethinyl estradiol  (LO/OVRAL ) 0.3-30 MG-MCG tablet Take 2 tablets by mouth daily. Until bleeding stops. Then take 1 tablet daily to finish pack. 12/29/19 01/09/20  Dorrene Nest, MD    Family History Family History  Problem Relation Age of Onset   Sickle cell trait Mother    Anxiety disorder Mother    Migraines Brother    Allergic rhinitis Brother    Migraines Maternal Grandmother    Hypertension Maternal Grandmother    Heart disease Maternal Grandmother    Asthma Paternal Grandmother    Menstrual problems Sister    Allergic rhinitis Sister    Hypertension Maternal Grandfather    Hyperlipidemia Maternal Grandfather    Sickle cell trait Maternal Grandfather    Menstrual problems Paternal Aunt    Fibroids Paternal Aunt     Social History Social History   Tobacco Use   Smoking status: Never    Passive exposure: Yes   Smokeless tobacco: Never   Tobacco comments:    smoking outside  Vaping Use   Vaping status: Never Used  Substance Use Topics   Alcohol use: No   Drug use: No     Allergies   Ketorolac  and Toradol  [ketorolac  tromethamine ]   Review of Systems Review of Systems  Constitutional:  Positive for activity change. Negative for appetite change, fatigue and fever.  HENT:  Positive for congestion and sinus  pressure. Negative for nosebleeds, postnasal drip, sneezing and sore throat.   Respiratory:  Negative for cough and shortness of breath.   Cardiovascular:  Negative for chest pain.  Gastrointestinal:  Negative for abdominal pain, diarrhea, nausea and vomiting.  Neurological:  Negative for dizziness, light-headedness and headaches.     Physical Exam Triage Vital Signs ED Triage Vitals  Encounter Vitals Group     BP 09/26/24 0821 117/81     Girls Systolic BP Percentile --      Girls Diastolic BP Percentile --      Boys Systolic BP Percentile --  Boys Diastolic BP Percentile --      Pulse Rate 09/26/24 0821 (!) 103     Resp 09/26/24 0821 20     Temp 09/26/24 0821 98.3 F (36.8 C)     Temp Source 09/26/24 0821 Oral     SpO2 09/26/24 0821 98 %     Weight 09/26/24 0819 210 lb 1.6 oz (95.3 kg)     Height 09/26/24 0819 5' 3 (1.6 m)     Head Circumference --      Peak Flow --      Pain Score 09/26/24 0819 4     Pain Loc --      Pain Education --      Exclude from Growth Chart --    No data found.  Updated Vital Signs BP 117/81 (BP Location: Left Arm)   Pulse 100   Temp 98.3 F (36.8 C) (Oral)   Resp 20   Ht 5' 3 (1.6 m)   Wt 210 lb 1.6 oz (95.3 kg)   LMP  (LMP Unknown)   SpO2 98%   BMI 37.22 kg/m   Visual Acuity Right Eye Distance:   Left Eye Distance:   Bilateral Distance:    Right Eye Near:   Left Eye Near:    Bilateral Near:     Physical Exam Vitals reviewed.  Constitutional:      General: She is awake. She is not in acute distress.    Appearance: Normal appearance. She is well-developed. She is not ill-appearing.     Comments: Very pleasant female appears stated age in no acute distress sitting comfortable in exam room  HENT:     Head: Normocephalic and atraumatic.     Right Ear: Tympanic membrane, ear canal and external ear normal. Tympanic membrane is not erythematous or bulging.     Left Ear: Tympanic membrane, ear canal and external ear normal.  Tympanic membrane is not erythematous or bulging.     Nose: Nasal tenderness and congestion present. No nasal deformity or septal deviation.     Right Nostril: No epistaxis or septal hematoma.     Left Nostril: No epistaxis or septal hematoma.     Right Turbinates: Swollen.     Left Turbinates: Swollen.     Right Sinus: Maxillary sinus tenderness present. No frontal sinus tenderness.     Left Sinus: No maxillary sinus tenderness or frontal sinus tenderness.      Comments: Tender to palpation over right maxillary sinus.  Bilateral swollen turbinates.  Nares are patent.  Tenderness over right nasal bridge without deformity.    Mouth/Throat:     Pharynx: Uvula midline. No oropharyngeal exudate or posterior oropharyngeal erythema.  Cardiovascular:     Rate and Rhythm: Normal rate and regular rhythm.     Heart sounds: Normal heart sounds, S1 normal and S2 normal. No murmur heard. Pulmonary:     Effort: Pulmonary effort is normal.     Breath sounds: Normal breath sounds. No wheezing, rhonchi or rales.     Comments: Clear to auscultation bilaterally Psychiatric:        Behavior: Behavior is cooperative.      UC Treatments / Results  Labs (all labs ordered are listed, but only abnormal results are displayed) Labs Reviewed - No data to display  EKG   Radiology DG Nasal Bones Result Date: 09/26/2024 EXAM: VIEW(S) XRAY OF THE NASAL BONES 09/26/2024 08:48:51 AM COMPARISON: Brain MRI 05/14/2024. CLINICAL HISTORY: Pain and swelling. FINDINGS: BONES: No displaced nasal  fracture detected. SINUSES: No air-fluid level. SOFT TISSUES: The soft tissues are unremarkable. MASTOID AIR CELLS: A vascular stent is again seen in the projection of the left mastoid air cells corresponding to the right transverse and sigmoid dural venous sinuses, as noted on the previous exam. IMPRESSION: 1. No displaced nasal fracture detected. Electronically signed by: Waddell Calk MD 09/26/2024 08:57 AM EST RP Workstation:  [id number]    Procedures Procedures (including critical care time)  Medications Ordered in UC Medications - No data to display  Initial Impression / Assessment and Plan / UC Course  I have reviewed the triage vital signs and the nursing notes.  Pertinent labs & imaging results that were available during my care of the patient were reviewed by me and considered in my medical decision making (see chart for details).     Patient is well-appearing, afebrile, nontoxic, nontachycardic.  No septal hematoma or obvious occlusion on physical exam.  Unclear etiology of symptoms but I am concerned for ethmoid sinusitis given location of pain and associated congestion.  Nasal films were obtained to rule out any osseous abnormality which were normal.  Will start Augmentin  twice daily for 7 days.  No indication for dose adjustment based on metabolic panel from 05/14/2024 with a creatinine of 0.78 and calculated creatinine clearance of 170 mL/min.  She was encouraged to use Tylenol  and ibuprofen  for pain relief.  We discussed that if her symptoms are not improving quickly with antibiotics she should follow-up with ENT and was given the contact information for a local provider.  If she has any worsening or changing symptoms including increasing pain, swelling of her nose, pain around her eye or pain with movement of her eye, fever, weakness that she needs to be seen emergently.  Strict return precautions given.  Excuse note provided.  Final Clinical Impressions(s) / UC Diagnoses   Final diagnoses:  Nasal pain  Acute non-recurrent ethmoidal sinusitis     Discharge Instructions      Your nasal bone films were normal.  Continue Tylenol  and ibuprofen  for pain.  We are going to start an antibiotic to cover for any kind of infection.  Start Augmentin  twice daily for 7 days.  Take this with food as it can upset your stomach.  I would remove all of your jewelry in case that is contributing to the symptoms until  after your symptoms go away.  I have given you the contact information for an ear nose and throat doctor in case your symptoms are not improving; call them to schedule an appointment.  If anything worsens and you have increasing pain, fever, difficulty breathing through your nose, nausea/vomiting you need to be seen immediately.     ED Prescriptions     Medication Sig Dispense Auth. Provider   amoxicillin -clavulanate (AUGMENTIN ) 875-125 MG tablet Take 1 tablet by mouth every 12 (twelve) hours. 14 tablet Selso Mannor K, PA-C      PDMP not reviewed this encounter.   Sherrell Rocky POUR, PA-C 09/26/24 [id number]

## 2024-09-26 NOTE — ED Triage Notes (Signed)
 Patient reports nose swelling. First noticed last night. No recent piercing or injury known. No fever. No runny nose but history of nose bleeds. No recent cold or illness.

## 2024-09-26 NOTE — Discharge Instructions (Signed)
 Your nasal bone films were normal.  Continue Tylenol  and ibuprofen  for pain.  We are going to start an antibiotic to cover for any kind of infection.  Start Augmentin  twice daily for 7 days.  Take this with food as it can upset your stomach.  I would remove all of your jewelry in case that is contributing to the symptoms until after your symptoms go away.  I have given you the contact information for an ear nose and throat doctor in case your symptoms are not improving; call them to schedule an appointment.  If anything worsens and you have increasing pain, fever, difficulty breathing through your nose, nausea/vomiting you need to be seen immediately.
